# Patient Record
Sex: Female | Born: 2000
Health system: Southern US, Community
[De-identification: ages and names within clinical notes are randomized; demographics above are authoritative.]

## PROBLEM LIST (undated history)

## (undated) DIAGNOSIS — D571 Sickle-cell disease without crisis: Secondary | ICD-10-CM

## (undated) DIAGNOSIS — H539 Unspecified visual disturbance: Secondary | ICD-10-CM

## (undated) HISTORY — PX: NO PAST SURGERIES: SHX2092

---
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
## 2020-01-27 ENCOUNTER — Other Ambulatory Visit: Payer: Self-pay

## 2020-01-27 ENCOUNTER — Inpatient Hospital Stay (HOSPITAL_COMMUNITY)
Admission: AD | Admit: 2020-01-27 | Discharge: 2020-01-28 | Disposition: A | Discharge status: Home / Self Care | Payer: Medicaid Other | Attending: Obstetrics and Gynecology | Admitting: Obstetrics and Gynecology

## 2020-01-27 ENCOUNTER — Inpatient Hospital Stay (HOSPITAL_BASED_OUTPATIENT_CLINIC_OR_DEPARTMENT_OTHER): Payer: Medicaid Other

## 2020-01-27 ENCOUNTER — Encounter (HOSPITAL_COMMUNITY): Payer: Self-pay | Admitting: Obstetrics and Gynecology

## 2020-01-27 DIAGNOSIS — Z3A21 21 weeks gestation of pregnancy: Secondary | ICD-10-CM | POA: Insufficient documentation

## 2020-01-27 DIAGNOSIS — Z3687 Encounter for antenatal screening for uncertain dates: Secondary | ICD-10-CM | POA: Diagnosis not present

## 2020-01-27 DIAGNOSIS — Z3A27 27 weeks gestation of pregnancy: Secondary | ICD-10-CM

## 2020-01-27 DIAGNOSIS — D571 Sickle-cell disease without crisis: Secondary | ICD-10-CM

## 2020-01-27 DIAGNOSIS — O26892 Other specified pregnancy related conditions, second trimester: Secondary | ICD-10-CM

## 2020-01-27 DIAGNOSIS — R519 Headache, unspecified: Secondary | ICD-10-CM | POA: Diagnosis not present

## 2020-01-27 DIAGNOSIS — O0932 Supervision of pregnancy with insufficient antenatal care, second trimester: Secondary | ICD-10-CM | POA: Insufficient documentation

## 2020-01-27 DIAGNOSIS — Z363 Encounter for antenatal screening for malformations: Secondary | ICD-10-CM

## 2020-01-27 DIAGNOSIS — O99012 Anemia complicating pregnancy, second trimester: Secondary | ICD-10-CM

## 2020-01-27 DIAGNOSIS — G4489 Other headache syndrome: Secondary | ICD-10-CM

## 2020-01-27 LAB — RAPID URINE DRUG SCREEN, HOSP PERFORMED
Amphetamines: NOT DETECTED
Barbiturates: NOT DETECTED
Benzodiazepines: NOT DETECTED
Cocaine: NOT DETECTED
Opiates: NOT DETECTED
Tetrahydrocannabinol: NOT DETECTED

## 2020-01-27 LAB — COMPREHENSIVE METABOLIC PANEL
ALT: 14 U/L (ref 0–44)
AST: 19 U/L (ref 15–41)
Albumin: 3 g/dL — ABNORMAL LOW (ref 3.5–5.0)
Alkaline Phosphatase: 73 U/L (ref 38–126)
Anion gap: 9 (ref 5–15)
BUN: <5 mg/dL — ABNORMAL LOW (ref 6–20)
CO2: 22 mmol/L (ref 22–32)
Calcium: 8.6 mg/dL — ABNORMAL LOW (ref 8.9–10.3)
Chloride: 105 mmol/L (ref 98–111)
Creatinine, Ser: 0.48 mg/dL (ref 0.44–1.00)
GFR, Estimated: >60 mL/min (ref >60)
Glucose, Bld: 114 mg/dL — ABNORMAL HIGH (ref 70–99)
Potassium: 3.4 mmol/L — ABNORMAL LOW (ref 3.5–5.1)
Sodium: 136 mmol/L (ref 135–145)
Total Bilirubin: 0.9 mg/dL (ref 0.3–1.2)
Total Protein: 6.4 g/dL — ABNORMAL LOW (ref 6.5–8.1)

## 2020-01-27 LAB — CBC WITH DIFFERENTIAL/PLATELET
Abs Immature Granulocytes: 0.08 10*3/uL — ABNORMAL HIGH (ref 0.00–0.07)
Basophils Absolute: 0.1 10*3/uL (ref 0.0–0.1)
Basophils Relative: 0 %
Eosinophils Absolute: 0.6 10*3/uL — ABNORMAL HIGH (ref 0.0–0.5)
Eosinophils Relative: 5 %
HCT: 27.5 % — ABNORMAL LOW (ref 36.0–46.0)
Hemoglobin: 10.3 g/dL — ABNORMAL LOW (ref 12.0–15.0)
Immature Granulocytes: 1 %
Lymphocytes Relative: 26 %
Lymphs Abs: 3.1 10*3/uL (ref 0.7–4.0)
MCH: 31.7 pg (ref 26.0–34.0)
MCHC: 37.5 g/dL — ABNORMAL HIGH (ref 30.0–36.0)
MCV: 84.6 fL (ref 80.0–100.0)
Monocytes Absolute: 1.3 10*3/uL — ABNORMAL HIGH (ref 0.1–1.0)
Monocytes Relative: 11 %
Neutro Abs: 6.6 10*3/uL (ref 1.7–7.7)
Neutrophils Relative %: 57 %
Platelets: 249 10*3/uL (ref 150–400)
RBC: 3.25 MIL/uL — ABNORMAL LOW (ref 3.87–5.11)
RDW: 14.1 % (ref 11.5–15.5)
WBC: 11.7 10*3/uL — ABNORMAL HIGH (ref 4.0–10.5)
nRBC: 1.4 % — ABNORMAL HIGH (ref 0.0–0.2)

## 2020-01-27 LAB — OB RESULTS CONSOLE GBS: GBS: POSITIVE

## 2020-01-27 LAB — URINALYSIS, ROUTINE W REFLEX MICROSCOPIC
Bilirubin Urine: NEGATIVE
Glucose, UA: NEGATIVE mg/dL
Hgb urine dipstick: NEGATIVE
Ketones, ur: NEGATIVE mg/dL
Nitrite: NEGATIVE
Protein, ur: NEGATIVE mg/dL
Specific Gravity, Urine: 1.01 (ref 1.005–1.030)
WBC, UA: >50 WBC/hpf — ABNORMAL HIGH (ref 0–5)
pH: 6 (ref 5.0–8.0)

## 2020-01-27 LAB — RETICULOCYTES
Immature Retic Fract: 32.6 % — ABNORMAL HIGH (ref 2.3–15.9)
RBC.: 3.25 MIL/uL — ABNORMAL LOW (ref 3.87–5.11)
Retic Count, Absolute: 194.3 10*3/uL — ABNORMAL HIGH (ref 19.0–186.0)
Retic Ct Pct: 6 % — ABNORMAL HIGH (ref 0.4–3.1)

## 2020-01-27 LAB — WET PREP, GENITAL
Clue Cells Wet Prep HPF POC: NONE SEEN
Sperm: NONE SEEN
Trich, Wet Prep: NONE SEEN
Yeast Wet Prep HPF POC: NONE SEEN

## 2020-01-27 MED ORDER — LORATADINE 10 MG PO TABS
10.0000 mg | ORAL_TABLET | Freq: Every day | ORAL | Status: DC
  Administered 2020-01-27: 10 mg via ORAL
  Filled 2020-01-27: qty 1

## 2020-01-27 MED ORDER — PREPLUS 27-1 MG PO TABS: 1.0000 | ORAL_TABLET | Freq: Every day | ORAL | 13 refills | Status: DC

## 2020-01-27 MED ORDER — ACETAMINOPHEN 500 MG PO TABS
1000.0000 mg | ORAL_TABLET | Freq: Once | ORAL | Status: AC
Start: 2020-01-27 — End: 2020-01-27
  Administered 2020-01-27: 1000 mg via ORAL
  Filled 2020-01-27: qty 2

## 2020-01-27 MED ORDER — ACETAMINOPHEN 500 MG PO TABS: 500.0000 mg | ORAL_TABLET | Freq: Four times a day (QID) | ORAL | 2 refills | Status: DC | PRN

## 2020-01-27 NOTE — MAU Provider Note (Addendum)
History     CSN: [id number]  Arrival date and time: 01/27/20 1749   First Provider Initiated Contact with Patient 01/27/20 2053      Chief Complaint  Patient presents with  . Headache   HPI Amanda Burch is a 19 y.o. G1P0 at [redacted]w[redacted]d who presents to MAU with chief complaint of headache and concern for sickle cell crisis. Patient endorses recurrent daily headache. She is managing her pain with intermittent use of Tylenol but is out of pills. On arrival to MAU her pain score is 2/10, anterior in the middle of her forehead. Her pain does not radiate.   Patient states she does not plan to get prenatal care. She was considering an elective termination but realizes she "may be out of time". She has questions for CNM regarding "temporary adoption" so she can attend college.  She denies lower abdominal pain, vaginal bleeding, leaking of fluid, dysuria.   She has not initiated prenatal care  OB History    Gravida  1   Para      Term      Preterm      AB      Living        SAB      TAB      Ectopic      Multiple      Live Births              Past Medical History:  Diagnosis Date  . Sickle cell anemia (HCC)   . Vision abnormalities     Past Surgical History:  Procedure Laterality Date  . NO PAST SURGERIES      Family History  Problem Relation Age of Onset  . Asthma Mother   . Sarcoidosis Mother   . Deep vein thrombosis Mother     Social History   Tobacco Use  . Smoking status: Never Smoker  . Smokeless tobacco: Never Used  Substance Use Topics  . Alcohol use: No  . Drug use: No    Allergies: No Known Allergies  Medications Prior to Admission  Medication Sig Dispense Refill Last Dose  . HYDROcodone-acetaminophen (NORCO/VICODIN) 5-325 MG per tablet Take 1 tablet by mouth every 6 (six) hours as needed for moderate pain. 20 tablet 0   . ibuprofen (ADVIL,MOTRIN) 600 MG tablet Take 1 tablet (600 mg total) by mouth every 6 (six) hours. Scheduled for 2  additional days, then as needed for pain. 30 tablet 0   . polyethylene glycol (MIRALAX / GLYCOLAX) packet Take 136 g by mouth once. 136g (8 packet) in 32oz of fluid. 14 each 0     Review of Systems  Constitutional: Positive for fatigue.  Neurological: Positive for headaches.  All other systems reviewed and are negative.  Physical Exam   Blood pressure (!) 134/55, pulse 92, temperature 98.8 F (37.1 C), temperature source Oral, resp. rate 17, height 5\' 7"  (1.702 m), weight 88.5 kg, last menstrual period 09/02/2019, SpO2 100 %.  Physical Exam Vitals and nursing note reviewed.  Constitutional:      Appearance: She is well-developed. She is not ill-appearing.  Cardiovascular:     Rate and Rhythm: Normal rate.     Heart sounds: Normal heart sounds.  Pulmonary:     Effort: Pulmonary effort is normal.     Breath sounds: Normal breath sounds.  Abdominal:     Palpations: Abdomen is soft.     Comments: Gravid, FH 24cm  Skin:    General: Skin is warm  and dry.     Capillary Refill: Capillary refill takes less than 2 seconds.  Neurological:     Mental Status: She is alert.  Psychiatric:        Mood and Affect: Mood normal.        Speech: Speech normal.        Behavior: Behavior normal.     MAU Course  Procedures  Results for orders placed or performed during the hospital encounter of 01/27/20 (from the past 24 hour(s))  Urinalysis, Routine w reflex microscopic Urine, Clean Catch     Status: Abnormal   Collection Time: 01/27/20  7:48 PM  Result Value Ref Range   Color, Urine YELLOW YELLOW   APPearance HAZY (A) CLEAR   Specific Gravity, Urine 1.010 1.005 - 1.030   pH 6.0 5.0 - 8.0   Glucose, UA NEGATIVE NEGATIVE mg/dL   Hgb urine dipstick NEGATIVE NEGATIVE   Bilirubin Urine NEGATIVE NEGATIVE   Ketones, ur NEGATIVE NEGATIVE mg/dL   Protein, ur NEGATIVE NEGATIVE mg/dL   Nitrite NEGATIVE NEGATIVE   Leukocytes,Ua LARGE (A) NEGATIVE   RBC / HPF 0-5 0 - 5 RBC/hpf   WBC, UA >50 (H)  0 - 5 WBC/hpf   Bacteria, UA MANY (A) NONE SEEN   Squamous Epithelial / LPF 0-5 0 - 5   Mucus PRESENT   Rapid urine drug screen (hospital performed)     Status: None   Collection Time: 01/27/20  7:48 PM  Result Value Ref Range   Opiates NONE DETECTED NONE DETECTED   Cocaine NONE DETECTED NONE DETECTED   Benzodiazepines NONE DETECTED NONE DETECTED   Amphetamines NONE DETECTED NONE DETECTED   Tetrahydrocannabinol NONE DETECTED NONE DETECTED   Barbiturates NONE DETECTED NONE DETECTED  CBC with Differential/Platelet     Status: Abnormal   Collection Time: 01/27/20  8:35 PM  Result Value Ref Range   WBC 11.7 (H) 4.0 - 10.5 K/uL   RBC 3.25 (L) 3.87 - 5.11 MIL/uL   Hemoglobin 10.3 (L) 12.0 - 15.0 g/dL   HCT 16.1 (L) 36 - 46 %   MCV 84.6 80.0 - 100.0 fL   MCH 31.7 26.0 - 34.0 pg   MCHC 37.5 (H) 30.0 - 36.0 g/dL   RDW 09.6 04.5 - 40.9 %   Platelets 249 150 - 400 K/uL   nRBC 1.4 (H) 0.0 - 0.2 %   Neutrophils Relative % 57 %   Neutro Abs 6.6 1.7 - 7.7 K/uL   Lymphocytes Relative 26 %   Lymphs Abs 3.1 0.7 - 4.0 K/uL   Monocytes Relative 11 %   Monocytes Absolute 1.3 (H) 0.1 - 1.0 K/uL   Eosinophils Relative 5 %   Eosinophils Absolute 0.6 (H) 0.0 - 0.5 K/uL   Basophils Relative 0 %   Basophils Absolute 0.1 0.0 - 0.1 K/uL   Immature Granulocytes 1 %   Abs Immature Granulocytes 0.08 (H) 0.00 - 0.07 K/uL  Reticulocytes     Status: Abnormal   Collection Time: 01/27/20  8:35 PM  Result Value Ref Range   Retic Ct Pct 6.0 (H) 0.4 - 3.1 %   RBC. 3.25 (L) 3.87 - 5.11 MIL/uL   Retic Count, Absolute 194.3 (H) 19.0 - 186.0 K/uL   Immature Retic Fract 32.6 (H) 2.3 - 15.9 %  Comprehensive metabolic panel     Status: Abnormal   Collection Time: 01/27/20  8:35 PM  Result Value Ref Range   Sodium 136 135 - 145 mmol/L   Potassium  3.4 (L) 3.5 - 5.1 mmol/L   Chloride 105 98 - 111 mmol/L   CO2 22 22 - 32 mmol/L   Glucose, Bld 114 (H) 70 - 99 mg/dL   BUN <5 (L) 6 - 20 mg/dL   Creatinine, Ser 4.97  0.44 - 1.00 mg/dL   Calcium 8.6 (L) 8.9 - 10.3 mg/dL   Total Protein 6.4 (L) 6.5 - 8.1 g/dL   Albumin 3.0 (L) 3.5 - 5.0 g/dL   AST 19 15 - 41 U/L   ALT 14 0 - 44 U/L   Alkaline Phosphatase 73 38 - 126 U/L   Total Bilirubin 0.9 0.3 - 1.2 mg/dL   GFR, Estimated >02 >63 mL/min   Anion gap 9 5 - 15  Wet prep, genital     Status: Abnormal   Collection Time: 01/27/20  9:45 PM   Specimen: Vaginal  Result Value Ref Range   Yeast Wet Prep HPF POC NONE SEEN NONE SEEN   Trich, Wet Prep NONE SEEN NONE SEEN   Clue Cells Wet Prep HPF POC NONE SEEN NONE SEEN   WBC, Wet Prep HPF POC MANY (A) NONE SEEN   Sperm NONE SEEN      Orders Placed This Encounter  Procedures  . Wet prep, genital  . [place] MFM OB LIMITED  . Urinalysis, Routine w reflex microscopic Urine, Clean Catch  . CBC with Differential/Platelet  . Reticulocytes  . Comprehensive metabolic panel  . Rapid urine drug screen (hospital performed)  . Nursing communication    Patient Vitals for the past 24 hrs:  BP Temp Temp src Pulse Resp SpO2 Height Weight  01/27/20 1900 (!) 134/55 98.8 F (37.1 C) Oral 92 17 100 % 5\' 7"  (1.702 m) 88.5 kg    Report given to , CNM who assumes care of patient at this time  Sabas Sous, MSN, CNM Certified Nurse Midwife, [employer] [facility] [employer], [facility] 01/27/20 10:02 PM   Assessment and Plan      Reassessment (11:50 PM) 14/03/21 results as above. Provider to bedside to discuss. Informed that EDD would be changed to reflect GA of 27.2 weeks. Cautioned that this is based on preliminary report and could be changed after review by MD.  Emphasized need for initiation of prenatal care. Discussed proper nutrition during pregnancy. Informed of bacteria in urine.  Will send UC.  Patient reports some difficulties with transportation and [facility] ~ 10 minutes from her home.  Message sent to staff for scheduling of appt.  Patient instructed to contact  [facility] if she can not get an appt at [facility].  Given expectation for first PN visit to include labs, glucola, and possibly [facility].  Rx for PNV sent to pharmacy on file.  Patient expressed desire for adoption. Reassured that resources/information is available. Encouraged to call or return to MAU if symptoms worsen or with the onset of new symptoms. Discharged to home in stable condition.  [place] MSN, CNM Advanced Practice Provider, [facility]

## 2020-01-27 NOTE — MAU Note (Signed)
Has had headaches, been going on since 2018 or 2018.  Seem to be getting worse. Always on the rt side. Happens every day.  Real "slight" right now.  When at work, she gets dizzy.  Been feeling weak. Has not been seen yet with preg.  Didn't having insurance, now 'has her card'.

## 2020-01-28 NOTE — Discharge Instructions (Signed)
Eating Plan for Pregnant Women While you are pregnant, your body requires additional nutrition to help support your growing baby. You also have a higher need for some vitamins and minerals, such as folic acid, calcium, iron, and vitamin D. Eating a healthy, well-balanced diet is very important for your health and your baby's health. Your need for extra calories varies for the three 55-month segments of your pregnancy (trimesters). For most women, it is recommended to consume:  150 extra calories a day during the first trimester.  300 extra calories a day during the second trimester.  300 extra calories a day during the third trimester. What are tips for following this plan?   Do not try to lose weight or go on a diet during pregnancy.  Limit your overall intake of foods that have "empty calories." These are foods that have little nutritional value, such as sweets, desserts, candies, and sugar-sweetened beverages.  Eat a variety of foods (especially fruits and vegetables) to get a full range of vitamins and minerals.  Take a prenatal vitamin to help meet your additional vitamin and mineral needs during pregnancy, specifically for folic acid, iron, calcium, and vitamin D.  Remember to stay active. Ask your health care provider what types of exercise and activities are safe for you.  Practice good food safety and cleanliness. Wash your hands before you eat and after you prepare raw meat. Wash all fruits and vegetables well before peeling or eating. Taking these actions can help to prevent food-borne illnesses that can be very dangerous to your baby, such as listeriosis. Ask your health care provider for more information about listeriosis. What does 150 extra calories look like? Healthy options that provide 150 extra calories each day could be any of the following:  6-8 oz (170-230 g) of plain low-fat yogurt with  cup of berries.  1 apple with 2 teaspoons (11 g) of peanut butter.  Cut-up  vegetables with  cup (60 g) of hummus.  8 oz (230 mL) or 1 cup of low-fat chocolate milk.  1 stick of string cheese with 1 medium orange.  1 peanut butter and jelly sandwich that is made with one slice of whole-wheat bread and 1 tsp (5 g) of peanut butter. For 300 extra calories, you could eat two of those healthy options each day. What is a healthy amount of weight to gain? The right amount of weight gain for you is based on your BMI before you became pregnant. If your BMI:  Was less than 18 (underweight), you should gain 28-40 lb (13-18 kg).  Was 18-24.9 (normal), you should gain 25-35 lb (11-16 kg).  Was 25-29.9 (overweight), you should gain 15-25 lb (7-11 kg).  Was 30 or greater (obese), you should gain 11-20 lb (5-9 kg). What if I am having twins or multiples? Generally, if you are carrying twins or multiples:  You may need to eat 300-600 extra calories a day.  The recommended range for total weight gain is 25-54 lb (11-25 kg), depending on your BMI before pregnancy.  Talk with your health care provider to find out about nutritional needs, weight gain, and exercise that is right for you. What foods can I eat?  Fruits All fruits. Eat a variety of colors and types of fruit. Remember to wash your fruits well before peeling or eating. Vegetables All vegetables. Eat a variety of colors and types of vegetables. Remember to wash your vegetables well before peeling or eating. Grains All grains. Choose whole grains, such  as whole-wheat bread, oatmeal, or brown rice. Meats and other protein foods Lean meats, including chicken, [place], fish, and lean cuts of beef, veal, or pork. If you eat fish or seafood, choose options that are higher in omega-3 fatty acids and lower in mercury, such as salmon, herring, mussels, trout, sardines, pollock, shrimp, crab, and lobster. Tofu. Tempeh. Beans. Eggs. Peanut butter and other nut butters. Make sure that all meats, poultry, and eggs are cooked to  food-safe temperatures or "well-done." Two or more servings of fish are recommended each week in order to get the most benefits from omega-3 fatty acids that are found in seafood. Choose fish that are lower in mercury. You can find more information online:  GuamGaming.ch Dairy Pasteurized milk and milk alternatives (such as almond milk). Pasteurized yogurt and pasteurized cheese. Cottage cheese. Sour cream. Beverages Water. Juices that contain 100% fruit juice or vegetable juice. Caffeine-free teas and decaffeinated coffee. Drinks that contain caffeine are okay to drink, but it is better to avoid caffeine. Keep your total caffeine intake to less than 200 mg each day (which is 12 oz or 355 mL of coffee, tea, or soda) or the limit as told by your health care provider. Fats and oils Fats and oils are okay to include in moderation. Sweets and desserts Sweets and desserts are okay to include in moderation. Seasoning and other foods All pasteurized condiments. The items listed above may not be a complete list of foods and beverages you can eat. Contact a dietitian for more information. What foods are not recommended? Fruits Unpasteurized fruit juices. Vegetables Raw (unpasteurized) vegetable juices. Meats and other protein foods Lunch meats, bologna, hot dogs, or other deli meats. (If you must eat those meats, reheat them until they are steaming hot.) Refrigerated pat, meat spreads from a meat counter, smoked seafood that is found in the refrigerated section of a store. Raw or undercooked meats, poultry, and eggs. Raw fish, such as sushi or sashimi. Fish that have high mercury content, such as tilefish, shark, swordfish, and king mackerel. To learn more about mercury in fish, talk with your health care provider or look for online resources, such as:  [employer]Gaming.ch Dairy Raw (unpasteurized) milk and any foods that have raw milk in them. Soft cheeses, such as feta, queso blanco, queso fresco, Brie,  Camembert cheeses, blue-veined cheeses, and Panela cheese (unless it is made with pasteurized milk, which must be stated on the label). Beverages Alcohol. Sugar-sweetened beverages, such as sodas, teas, or energy drinks. Seasoning and other foods Homemade fermented foods and drinks, such as pickles, sauerkraut, or kombucha drinks. (Store-bought pasteurized versions of these are okay.) Salads that are made in a store or deli, such as ham salad, chicken salad, egg salad, tuna salad, and seafood salad. The items listed above may not be a complete list of foods and beverages you should avoid. Contact a dietitian for more information. Where to find more information To calculate the number of calories you need based on your height, weight, and activity level, you can use an online calculator such as:  MobileTransition.ch To calculate how much weight you should gain during pregnancy, you can use an online pregnancy weight gain calculator such as:  StreamingFood.com.cy Summary  While you are pregnant, your body requires additional nutrition to help support your growing baby.  Eat a variety of foods, especially fruits and vegetables to get a full range of vitamins and minerals.  Practice good food safety and cleanliness. Wash your hands before you eat  and after you prepare raw meat. Wash all fruits and vegetables well before peeling or eating. Taking these actions can help to prevent food-borne illnesses, such as listeriosis, that can be very dangerous to your baby.  Do not eat raw meat or fish. Do not eat fish that have high mercury content, such as tilefish, shark, swordfish, and king mackerel. Do not eat unpasteurized (raw) dairy.  Take a prenatal vitamin to help meet your additional vitamin and mineral needs during pregnancy, specifically for folic acid, iron, calcium, and vitamin D. This information is not intended to replace advice given to you by  your health care provider. Make sure you discuss any questions you have with your health care provider. Document Revised: 07/01/2018 Document Reviewed: 11/07/2016 Elsevier Patient Education  2020 [employer]. Third Trimester of Pregnancy The third trimester is from week 28 through week 40 (months 7 through 9). The third trimester is a time when the unborn baby (fetus) is growing rapidly. At the end of the ninth month, the fetus is about 20 inches in length and weighs 6-10 pounds. Body changes during your third trimester Your body will continue to go through many changes during pregnancy. The changes vary from woman to woman. During the third trimester:  Your weight will continue to increase. You can expect to gain 25-35 pounds (11-16 kg) by the end of the pregnancy.  You may begin to get stretch marks on your hips, abdomen, and breasts.  You may urinate more often because the fetus is moving lower into your pelvis and pressing on your bladder.  You may develop or continue to have heartburn. This is caused by increased hormones that slow down muscles in the digestive tract.  You may develop or continue to have constipation because increased hormones slow digestion and cause the muscles that push waste through your intestines to relax.  You may develop hemorrhoids. These are swollen veins (varicose veins) in the rectum that can itch or be painful.  You may develop swollen, bulging veins (varicose veins) in your legs.  You may have increased body aches in the pelvis, back, or thighs. This is due to weight gain and increased hormones that are relaxing your joints.  You may have changes in your hair. These can include thickening of your hair, rapid growth, and changes in texture. Some women also have hair loss during or after pregnancy, or hair that feels dry or thin. Your hair will most likely return to normal after your baby is born.  Your breasts will continue to grow and they will continue  to become tender. A yellow fluid (colostrum) may leak from your breasts. This is the first milk you are producing for your baby.  Your belly button may stick out.  You may notice more swelling in your hands, face, or ankles.  You may have increased tingling or numbness in your hands, arms, and legs. The skin on your belly may also feel numb.  You may feel short of breath because of your expanding uterus.  You may have more problems sleeping. This can be caused by the size of your belly, increased need to urinate, and an increase in your body's metabolism.  You may notice the fetus "dropping," or moving lower in your abdomen (lightening).  You may have increased vaginal discharge.  You may notice your joints feel loose and you may have pain around your pelvic bone. What to expect at prenatal visits You will have prenatal exams every 2 weeks until week  36. Then you will have weekly prenatal exams. During a routine prenatal visit:  You will be weighed to make sure you and the baby are growing normally.  Your blood pressure will be taken.  Your abdomen will be measured to track your baby's growth.  The fetal heartbeat will be listened to.  Any test results from the previous visit will be discussed.  You may have a cervical check near your due date to see if your cervix has softened or thinned (effaced).  You will be tested for Group B streptococcus. This happens between 35 and 37 weeks. Your health care provider may ask you:  What your birth plan is.  How you are feeling.  If you are feeling the baby move.  If you have had any abnormal symptoms, such as leaking fluid, bleeding, severe headaches, or abdominal cramping.  If you are using any tobacco products, including cigarettes, chewing tobacco, and electronic cigarettes.  If you have any questions. Other tests or screenings that may be performed during your third trimester include:  Blood tests that check for low iron  levels (anemia).  Fetal testing to check the health, activity level, and growth of the fetus. Testing is done if you have certain medical conditions or if there are problems during the pregnancy.  Nonstress test (NST). This test checks the health of your baby to make sure there are no signs of problems, such as the baby not getting enough oxygen. During this test, a belt is placed around your belly. The baby is made to move, and its heart rate is monitored during movement. What is false labor? False labor is a condition in which you feel small, irregular tightenings of the muscles in the womb (contractions) that usually go away with rest, changing position, or drinking water. These are called Braxton Hicks contractions. Contractions may last for hours, days, or even weeks before true labor sets in. If contractions come at regular intervals, become more frequent, increase in intensity, or become painful, you should see your health care provider. What are the signs of labor?  Abdominal cramps.  Regular contractions that start at 10 minutes apart and become stronger and more frequent with time.  Contractions that start on the top of the uterus and spread down to the lower abdomen and back.  Increased pelvic pressure and dull back pain.  A watery or bloody mucus discharge that comes from the vagina.  Leaking of amniotic fluid. This is also known as your "water breaking." It could be a slow trickle or a gush. Let your health care provider know if it has a color or strange odor. If you have any of these signs, call your health care provider right away, even if it is before your due date. Follow these instructions at home: Medicines  Follow your health care provider's instructions regarding medicine use. Specific medicines may be either safe or unsafe to take during pregnancy.  Take a prenatal vitamin that contains at least 600 micrograms (mcg) of folic acid.  If you develop constipation, try  taking a stool softener if your health care provider approves. Eating and drinking   Eat a balanced diet that includes fresh fruits and vegetables, whole grains, good sources of protein such as meat, eggs, or tofu, and low-fat dairy. Your health care provider will help you determine the amount of weight gain that is right for you.  Avoid raw meat and uncooked cheese. These carry germs that can cause birth defects in the baby.  If you have low calcium intake from food, talk to your health care provider about whether you should take a daily calcium supplement.  Eat four or five small meals rather than three large meals a day.  Limit foods that are high in fat and processed sugars, such as fried and sweet foods.  To prevent constipation: ? Drink enough fluid to keep your urine clear or pale yellow. ? Eat foods that are high in fiber, such as fresh fruits and vegetables, whole grains, and beans. Activity  Exercise only as directed by your health care provider. Most women can continue their usual exercise routine during pregnancy. Try to exercise for 30 minutes at least 5 days a week. Stop exercising if you experience uterine contractions.  Avoid heavy lifting.  Do not exercise in extreme heat or humidity, or at high altitudes.  Wear low-heel, comfortable shoes.  Practice good posture.  You may continue to have sex unless your health care provider tells you otherwise. Relieving pain and discomfort  Take frequent breaks and rest with your legs elevated if you have leg cramps or low back pain.  Take warm sitz baths to soothe any pain or discomfort caused by hemorrhoids. Use hemorrhoid cream if your health care provider approves.  Wear a good support bra to prevent discomfort from breast tenderness.  If you develop varicose veins: ? Wear support pantyhose or compression stockings as told by your healthcare provider. ? Elevate your feet for 15 minutes, 3-4 times a day. Prenatal  care  Write down your questions. Take them to your prenatal visits.  Keep all your prenatal visits as told by your health care provider. This is important. Safety  Wear your seat belt at all times when driving.  Make a list of emergency phone numbers, including numbers for family, friends, the hospital, and police and fire departments. General instructions  Avoid cat litter boxes and soil used by cats. These carry germs that can cause birth defects in the baby. If you have a cat, ask someone to clean the litter box for you.  Do not travel far distances unless it is absolutely necessary and only with the approval of your health care provider.  Do not use hot tubs, steam rooms, or saunas.  Do not drink alcohol.  Do not use any products that contain nicotine or tobacco, such as cigarettes and e-cigarettes. If you need help quitting, ask your health care provider.  Do not use any medicinal herbs or unprescribed drugs. These chemicals affect the formation and growth of the baby.  Do not douche or use tampons or scented sanitary pads.  Do not cross your legs for long periods of time.  To prepare for the arrival of your baby: ? Take prenatal classes to understand, practice, and ask questions about labor and delivery. ? Make a trial run to the hospital. ? Visit the hospital and tour the maternity area. ? Arrange for maternity or paternity leave through employers. ? Arrange for family and friends to take care of pets while you are in the hospital. ? Purchase a rear-facing car seat and make sure you know how to install it in your car. ? Pack your hospital bag. ? Prepare the babys nursery. Make sure to remove all pillows and stuffed animals from the baby's crib to prevent suffocation.  Visit your dentist if you have not gone during your pregnancy. Use a soft toothbrush to brush your teeth and be gentle when you floss. Contact a health care provider  if:  You are unsure if you are in  labor or if your water has broken.  You become dizzy.  You have mild pelvic cramps, pelvic pressure, or nagging pain in your abdominal area.  You have lower back pain.  You have persistent nausea, vomiting, or diarrhea.  You have an unusual or bad smelling vaginal discharge.  You have pain when you urinate. Get help right away if:  Your water breaks before 37 weeks.  You have regular contractions less than 5 minutes apart before 37 weeks.  You have a fever.  You are leaking fluid from your vagina.  You have spotting or bleeding from your vagina.  You have severe abdominal pain or cramping.  You have rapid weight loss or weight gain.  You have shortness of breath with chest pain.  You notice sudden or extreme swelling of your face, hands, ankles, feet, or legs.  Your baby makes fewer than 10 movements in 2 hours.  You have severe headaches that do not go away when you take medicine.  You have vision changes. Summary  The third trimester is from week 28 through week 40, months 7 through 9. The third trimester is a time when the unborn baby (fetus) is growing rapidly.  During the third trimester, your discomfort may increase as you and your baby continue to gain weight. You may have abdominal, leg, and back pain, sleeping problems, and an increased need to urinate.  During the third trimester your breasts will keep growing and they will continue to become tender. A yellow fluid (colostrum) may leak from your breasts. This is the first milk you are producing for your baby.  False labor is a condition in which you feel small, irregular tightenings of the muscles in the womb (contractions) that eventually go away. These are called Braxton Hicks contractions. Contractions may last for hours, days, or even weeks before true labor sets in.  Signs of labor can include: abdominal cramps; regular contractions that start at 10 minutes apart and become stronger and more frequent  with time; watery or bloody mucus discharge that comes from the vagina; increased pelvic pressure and dull back pain; and leaking of amniotic fluid. This information is not intended to replace advice given to you by your health care provider. Make sure you discuss any questions you have with your health care provider. Document Revised: 06/03/2018 Document Reviewed: 03/18/2016 Elsevier Patient Education  2020 [employer]

## 2020-01-29 LAB — URINE CULTURE: Culture: >=100000 — AB

## 2020-01-30 ENCOUNTER — Encounter: Payer: Self-pay | Admitting: Family Medicine

## 2020-01-30 ENCOUNTER — Other Ambulatory Visit: Payer: Self-pay | Admitting: Family Medicine

## 2020-01-30 DIAGNOSIS — R8271 Bacteriuria: Secondary | ICD-10-CM | POA: Insufficient documentation

## 2020-01-30 LAB — GC/CHLAMYDIA PROBE AMP (~~LOC~~) NOT AT ARMC
Chlamydia: POSITIVE — AB
Comment: NEGATIVE
Comment: NORMAL
Neisseria Gonorrhea: NEGATIVE

## 2020-01-30 MED ORDER — AMOXICILLIN 875 MG PO TABS: 875.0000 mg | ORAL_TABLET | Freq: Two times a day (BID) | ORAL | 0 refills | Status: AC

## 2020-01-30 NOTE — Progress Notes (Signed)
Has GBS UTI (100k colonies). Unable to reach patient by phone - sent mychart message with request to respond so we know she got it.   Treat with Amoxicillin 875mg  BID.

## 2020-02-01 ENCOUNTER — Other Ambulatory Visit: Payer: Self-pay | Admitting: *Deleted

## 2020-02-01 DIAGNOSIS — O0932 Supervision of pregnancy with insufficient antenatal care, second trimester: Secondary | ICD-10-CM

## 2020-02-02 ENCOUNTER — Other Ambulatory Visit: Payer: Self-pay

## 2020-02-02 ENCOUNTER — Other Ambulatory Visit: Payer: Medicaid Other

## 2020-02-02 DIAGNOSIS — A749 Chlamydial infection, unspecified: Secondary | ICD-10-CM | POA: Insufficient documentation

## 2020-02-02 DIAGNOSIS — O0932 Supervision of pregnancy with insufficient antenatal care, second trimester: Secondary | ICD-10-CM

## 2020-02-02 HISTORY — DX: Chlamydial infection, unspecified: A74.9

## 2020-02-02 MED ORDER — AZITHROMYCIN 500 MG PO TABS: 1000.0000 mg | ORAL_TABLET | Freq: Once | ORAL | 0 refills | Status: AC

## 2020-02-02 MED ORDER — AZITHROMYCIN 500 MG PO TABS: 1000.0000 mg | ORAL_TABLET | Freq: Once | ORAL | 0 refills | Status: DC

## 2020-02-03 LAB — GLUCOSE TOLERANCE, 2 HOURS W/ 1HR
Glucose, 1 hour: 109 mg/dL (ref 65–179)
Glucose, 2 hour: 78 mg/dL (ref 65–152)
Glucose, Fasting: 81 mg/dL (ref 65–91)

## 2020-02-03 LAB — OBSTETRIC PANEL, INCLUDING HIV
Antibody Screen: NEGATIVE
Basophils Absolute: 0.1 10*3/uL (ref 0.0–0.2)
Basos: 1 %
EOS (ABSOLUTE): 0.4 10*3/uL (ref 0.0–0.4)
Eos: 5 %
HIV Screen 4th Generation wRfx: NONREACTIVE
Hematocrit: 29.7 % — ABNORMAL LOW (ref 34.0–46.6)
Hemoglobin: 10.3 g/dL — ABNORMAL LOW (ref 11.1–15.9)
Hepatitis B Surface Ag: NEGATIVE
Immature Grans (Abs): 0.1 10*3/uL (ref 0.0–0.1)
Immature Granulocytes: 1 %
Lymphocytes Absolute: 2.8 10*3/uL (ref 0.7–3.1)
Lymphs: 31 %
MCH: 31.3 pg (ref 26.6–33.0)
MCHC: 34.7 g/dL (ref 31.5–35.7)
MCV: 90 fL (ref 79–97)
Monocytes Absolute: 1 10*3/uL — ABNORMAL HIGH (ref 0.1–0.9)
Monocytes: 11 %
NRBC: 2 % — ABNORMAL HIGH (ref 0–0)
Neutrophils Absolute: 4.6 10*3/uL (ref 1.4–7.0)
Neutrophils: 51 %
Platelets: 271 10*3/uL (ref 150–450)
RBC: 3.29 x10E6/uL — ABNORMAL LOW (ref 3.77–5.28)
RDW: 14.3 % (ref 11.7–15.4)
RPR Ser Ql: NONREACTIVE
Rh Factor: POSITIVE
Rubella Antibodies, IGG: 3.38 index (ref >0.99)
WBC: 9 10*3/uL (ref 3.4–10.8)

## 2020-02-08 ENCOUNTER — Telehealth: Payer: Self-pay | Admitting: General Practice

## 2020-02-08 ENCOUNTER — Other Ambulatory Visit: Payer: Self-pay

## 2020-02-08 ENCOUNTER — Telehealth (INDEPENDENT_AMBULATORY_CARE_PROVIDER_SITE_OTHER): Payer: Medicaid Other | Admitting: *Deleted

## 2020-02-08 DIAGNOSIS — O0930 Supervision of pregnancy with insufficient antenatal care, unspecified trimester: Secondary | ICD-10-CM | POA: Insufficient documentation

## 2020-02-08 DIAGNOSIS — Z3A00 Weeks of gestation of pregnancy not specified: Secondary | ICD-10-CM

## 2020-02-08 DIAGNOSIS — D571 Sickle-cell disease without crisis: Secondary | ICD-10-CM

## 2020-02-08 DIAGNOSIS — O0990 Supervision of high risk pregnancy, unspecified, unspecified trimester: Secondary | ICD-10-CM | POA: Insufficient documentation

## 2020-02-08 DIAGNOSIS — O99019 Anemia complicating pregnancy, unspecified trimester: Secondary | ICD-10-CM

## 2020-02-08 NOTE — Telephone Encounter (Signed)
   Josetta Wigal DOB: 2000/09/03 MRN: [id number]   RIDER WAIVER AND RELEASE OF LIABILITY  For purposes of improving physical access to our facilities, [facility] is pleased to partner with third parties to provide [facility] patients or other authorized individuals the option of convenient, on-demand ground transportation services (the Chiropractor") through use of the technology service that enables users to request on-demand ground transportation from independent third-party providers.  By opting to use and accept these [employer], I, the undersigned, hereby agree on behalf of myself, and on behalf of any minor child using the [employer] for whom I am the parent or legal guardian, as follows:  1. Science writer provided to me are provided by independent third-party transportation providers who are not [employer] or employees and who are unaffiliated with [employer]. 2. [facility] is neither a transportation carrier nor a common or public carrier. 3. [facility] has no control over the quality or safety of the transportation that occurs as a result of the [employer]. 4. [facility] cannot guarantee that any third-party transportation provider will complete any arranged transportation service. 5. [facility] makes no representation, warranty, or guarantee regarding the reliability, timeliness, quality, safety, suitability, or availability of any of the Transport Services or that they will be error free. 6. I fully understand that traveling by vehicle involves risks and dangers of serious bodily injury, including permanent disability, paralysis, and death. I agree, on behalf of myself and on behalf of any minor child using the Transport Services for whom I am the parent or legal guardian, that the entire risk arising out of my use of the [employer] remains solely with me, to the maximum extent permitted under applicable law. 7. The [employer] are provided "as is" and "as available." [facility] disclaims all representations and warranties, express, implied or statutory, not expressly set out in these terms, including the implied warranties of merchantability and fitness for a particular purpose. 8. I hereby waive and release [facility], its agents, employees, officers, directors, representatives, insurers, attorneys, assigns, successors, subsidiaries, and affiliates from any and all past, present, or future claims, demands, liabilities, actions, causes of action, or suits of any kind directly or indirectly arising from acceptance and use of the [employer]. 9. I further waive and release  and its affiliates from all present and future liability and responsibility for any injury or death to persons or damages to property caused by or related to the use of the [employer]. 10. I have read this Waiver and Release of Liability, and I understand the terms used in it and their legal significance. This Waiver is freely and voluntarily given with the understanding that my right (as well as the right of any minor child for whom I am the parent or legal guardian using the [employer]) to legal recourse against  in connection with the [employer] is knowingly surrendered in return for use of these services.   I attest that I read the consent document to Crissie Reese, gave Ms. Cost the opportunity to ask questions and answered the questions asked (if any). I affirm that Marit Goodwill then provided consent for she's participation in this program.     Evette Doffing

## 2020-02-08 NOTE — Patient Instructions (Signed)
- At our [facility], we work as an integrated team, providing care to address both physical and emotional health. Your medical provider may refer you to see our Behavioral Health Clinician (BHC) on the same day you see your medical provider, as availability permits; often scheduled virtually at your convenience.  Our BHC is available to all patients, visits generally last between 20-30 minutes, but can be longer or shorter, depending on patient need. The BHC offers help with stress management, coping with symptoms of depression and anxiety, major life changes , sleep issues, changing risky behavior, grief and loss, life stress, working on personal life goals, and  behavioral health issues, as these all affect your overall health and wellness.  The BHC is NOT available for the following: FMLA paperwork, court-ordered evaluations, specialty assessments (custody or disability), letters to employers, or obtaining certification for an emotional support animal. The BHC does not provide long-term therapy. You have the right to refuse integrated behavioral health services, or to reschedule to see the BHC at a later date.  Confidentiality exception: If it is suspected that a child or disabled adult is being abused or neglected, we are required by law to report that to either Child Protective Services or Adult Protective Services.  If you have a diagnosis of Bipolar affective disorder, Schizophrenia, or recurrent Major depressive disorder, we will recommend that you establish care with a psychiatrist, as these are lifelong, chronic conditions, and we want your overall emotional health and medications to be more closely monitored. If you anticipate needing extended maternity leave due to mental health issues postpartum, it it recommended you inform your medical provider, so we can put in a referral to a  psychiatrist as soon as possible. The BHC is unable to recommend an extended maternity leave for mental  health issues. Your medical provider or BHC may refer you to a therapist for ongoing, traditional therapy, or to a psychiatrist, for medication management, if it would benefit your overall health. Depending on your insurance, you may have a copay to see the BHC. If you are uninsured, it is recommended that you apply for financial assistance. (Forms may be requested at the front desk for in-person visits, via MyChart, or request a form during a virtual visit).  If you see the BHC more than 6 times, you will have to complete a comprehensive clinical assessment interview with the BHC to resume integrated services.  For virtual visits with the BHC, you must be physically in the state of [place] at the time of the visit. For example, if you live in [place], you will have to do an in-person visit with the BHC, and your out-of-state insurance may not cover behavioral health services in [place]. f you are going out of the state or country for any reason, the BHC may see you virtually when you return to [place], but not while you are physically outside of .     Meet the Provider Zoom Sessions      [facility] is now offering FREE monthly 1-hour virtual Zoom sessions for new, current, and prospective patients.        During these sessions, you can:   Learn about our practice, model of care, services   Get answers to questions about pregnancy and birth during COVID   Pick your provider's brain about anything else!    Sessions will be hosted by [facility] Nurse Practitioners, Physician Assistants, Physicians and Midwives            No registration required      2021 Dates:      All at 6pm     October 21st     November 18th   December 16th     January 20th  February 17th    To join one of these meetings, a few minutes before it is set to start:     Copy/paste the link into your web  browser:  [email or web address]    OR  Scan the QR code below (open up your camera and point towards QR code; click on tab that pops up on your phone ("zoom")    

## 2020-02-08 NOTE — Progress Notes (Signed)
1024 Amanda Burch not connected virtually. I called her and verified her identity with 2 identifiers. I asked if she was trying to connect virtually as I could not see her but it appears she had tried. Call disconnected and I called her back and left a message I am trying to connect with you by phone or virtually for your visit and will try to connect virtually or by  Phone again in a moment- please be available. I called her a second time and she confirmed her identity and said she cannot connect virtually- she receives error message. I confirmed we will complete visit by phone.  Amanda Diekman,RN  New OB Intake  I connected with  Amanda Burch on 02/08/20 at 10:30 by telephone and verified that I am speaking with the correct person using two identifiers. Nurse is located at [facility] and pt is located at home.  I discussed the limitations, risks, security and privacy concerns of performing an evaluation and management service by telephone and the availability of in person appointments. I also discussed with the patient that there may be a patient responsible charge related to this service. The patient expressed understanding and agreed to proceed.  I explained I am completing New OB Intake today. We discussed her EDD of 04/25/2020 that is based on [place] per provider per chart. States had 2 periods in June  Or July . Pt is G1/P0. I reviewed her allergies, medications, Medical/Surgical/OB history. I informed her of [facility] services. Based on history, this is a/an complicated by sickle cell anemia, late prenatal care pregnancy.  Concerns addressed today  Plans for Delivery: I asked where she planned to deliver.She confirms she had thought about having a home delivery. I informed her we do not offer that service and if that is something she wants she will need to find a provider that can provider that. She confirmed she would prefer to continue with [place].   MyChart/Babyscripts MyChart access verified. I explained pt will  have some visits in office and some virtually    Call disconnected before visit completed. I called her back twice and was unable to connect with her; will need to complete at her in office prenatal visit due to advanced gestational age. Needs to have PHQ9/GAD7/Prenatal weight/ Blood pressure cuff ordered if she does not have one/Babyscripts app/ OB Box.  Amanda Sullenberger,RN 02/08/2020  10:31 AM

## 2020-02-09 ENCOUNTER — Ambulatory Visit (INDEPENDENT_AMBULATORY_CARE_PROVIDER_SITE_OTHER): Payer: Medicaid Other | Admitting: Family Medicine

## 2020-02-09 ENCOUNTER — Telehealth: Payer: Self-pay | Admitting: Lactation Services

## 2020-02-09 ENCOUNTER — Encounter: Payer: Self-pay | Admitting: *Deleted

## 2020-02-09 ENCOUNTER — Encounter: Payer: Self-pay | Admitting: Family Medicine

## 2020-02-09 VITALS — BP 114/60 | HR 78 | Wt 194.7 lb

## 2020-02-09 DIAGNOSIS — D571 Sickle-cell disease without crisis: Secondary | ICD-10-CM

## 2020-02-09 DIAGNOSIS — O0930 Supervision of pregnancy with insufficient antenatal care, unspecified trimester: Secondary | ICD-10-CM

## 2020-02-09 DIAGNOSIS — Z1331 Encounter for screening for depression: Secondary | ICD-10-CM

## 2020-02-09 DIAGNOSIS — O98812 Other maternal infectious and parasitic diseases complicating pregnancy, second trimester: Secondary | ICD-10-CM

## 2020-02-09 DIAGNOSIS — Q8901 Asplenia (congenital): Secondary | ICD-10-CM

## 2020-02-09 DIAGNOSIS — O0990 Supervision of high risk pregnancy, unspecified, unspecified trimester: Secondary | ICD-10-CM

## 2020-02-09 DIAGNOSIS — R8271 Bacteriuria: Secondary | ICD-10-CM

## 2020-02-09 DIAGNOSIS — A749 Chlamydial infection, unspecified: Secondary | ICD-10-CM

## 2020-02-09 DIAGNOSIS — Z658 Other specified problems related to psychosocial circumstances: Secondary | ICD-10-CM

## 2020-02-09 MED ORDER — BLOOD PRESSURE KIT DEVI: 1.0000 | Freq: Once | 0 refills | Status: AC

## 2020-02-09 NOTE — Progress Notes (Deleted)
***  vaccines for sickle ***took chlamydia and GBS treatment

## 2020-02-09 NOTE — Patient Instructions (Signed)
 Third Trimester of Pregnancy The third trimester is from week 28 through week 40 (months 7 through 9). The third trimester is a time when the unborn baby (fetus) is growing rapidly. At the end of the ninth month, the fetus is about 20 inches in length and weighs 6-10 pounds. Body changes during your third trimester Your body will continue to go through many changes during pregnancy. The changes vary from woman to woman. During the third trimester:  Your weight will continue to increase. You can expect to gain 25-35 pounds (11-16 kg) by the end of the pregnancy.  You may begin to get stretch marks on your hips, abdomen, and breasts.  You may urinate more often because the fetus is moving lower into your pelvis and pressing on your bladder.  You may develop or continue to have heartburn. This is caused by increased hormones that slow down muscles in the digestive tract.  You may develop or continue to have constipation because increased hormones slow digestion and cause the muscles that push waste through your intestines to relax.  You may develop hemorrhoids. These are swollen veins (varicose veins) in the rectum that can itch or be painful.  You may develop swollen, bulging veins (varicose veins) in your legs.  You may have increased body aches in the pelvis, back, or thighs. This is due to weight gain and increased hormones that are relaxing your joints.  You may have changes in your hair. These can include thickening of your hair, rapid growth, and changes in texture. Some women also have hair loss during or after pregnancy, or hair that feels dry or thin. Your hair will most likely return to normal after your baby is born.  Your breasts will continue to grow and they will continue to become tender. A yellow fluid (colostrum) may leak from your breasts. This is the first milk you are producing for your baby.  Your belly button may stick out.  You may notice more swelling in your  hands, face, or ankles.  You may have increased tingling or numbness in your hands, arms, and legs. The skin on your belly may also feel numb.  You may feel short of breath because of your expanding uterus.  You may have more problems sleeping. This can be caused by the size of your belly, increased need to urinate, and an increase in your body's metabolism.  You may notice the fetus "dropping," or moving lower in your abdomen (lightening).  You may have increased vaginal discharge.  You may notice your joints feel loose and you may have pain around your pelvic bone. What to expect at prenatal visits You will have prenatal exams every 2 weeks until week 36. Then you will have weekly prenatal exams. During a routine prenatal visit:  You will be weighed to make sure you and the baby are growing normally.  Your blood pressure will be taken.  Your abdomen will be measured to track your baby's growth.  The fetal heartbeat will be listened to.  Any test results from the previous visit will be discussed.  You may have a cervical check near your due date to see if your cervix has softened or thinned (effaced).  You will be tested for Group B streptococcus. This happens between 35 and 37 weeks. Your health care provider may ask you:  What your birth plan is.  How you are feeling.  If you are feeling the baby move.  If you have had any   abnormal symptoms, such as leaking fluid, bleeding, severe headaches, or abdominal cramping.  If you are using any tobacco products, including cigarettes, chewing tobacco, and electronic cigarettes.  If you have any questions. Other tests or screenings that may be performed during your third trimester include:  Blood tests that check for low iron levels (anemia).  Fetal testing to check the health, activity level, and growth of the fetus. Testing is done if you have certain medical conditions or if there are problems during the  pregnancy.  Nonstress test (NST). This test checks the health of your baby to make sure there are no signs of problems, such as the baby not getting enough oxygen. During this test, a belt is placed around your belly. The baby is made to move, and its heart rate is monitored during movement. What is false labor? False labor is a condition in which you feel small, irregular tightenings of the muscles in the womb (contractions) that usually go away with rest, changing position, or drinking water. These are called Braxton Hicks contractions. Contractions may last for hours, days, or even weeks before true labor sets in. If contractions come at regular intervals, become more frequent, increase in intensity, or become painful, you should see your health care provider. What are the signs of labor?  Abdominal cramps.  Regular contractions that start at 10 minutes apart and become stronger and more frequent with time.  Contractions that start on the top of the uterus and spread down to the lower abdomen and back.  Increased pelvic pressure and dull back pain.  A watery or bloody mucus discharge that comes from the vagina.  Leaking of amniotic fluid. This is also known as your "water breaking." It could be a slow trickle or a gush. Let your health care provider know if it has a color or strange odor. If you have any of these signs, call your health care provider right away, even if it is before your due date. Follow these instructions at home: Medicines  Follow your health care provider's instructions regarding medicine use. Specific medicines may be either safe or unsafe to take during pregnancy.  Take a prenatal vitamin that contains at least 600 micrograms (mcg) of folic acid.  If you develop constipation, try taking a stool softener if your health care provider approves. Eating and drinking   Eat a balanced diet that includes fresh fruits and vegetables, whole grains, good sources of protein  such as meat, eggs, or tofu, and low-fat dairy. Your health care provider will help you determine the amount of weight gain that is right for you.  Avoid raw meat and uncooked cheese. These carry germs that can cause birth defects in the baby.  If you have low calcium intake from food, talk to your health care provider about whether you should take a daily calcium supplement.  Eat four or five small meals rather than three large meals a day.  Limit foods that are high in fat and processed sugars, such as fried and sweet foods.  To prevent constipation: ? Drink enough fluid to keep your urine clear or pale yellow. ? Eat foods that are high in fiber, such as fresh fruits and vegetables, whole grains, and beans. Activity  Exercise only as directed by your health care provider. Most women can continue their usual exercise routine during pregnancy. Try to exercise for 30 minutes at least 5 days a week. Stop exercising if you experience uterine contractions.  Avoid heavy lifting.    Do not exercise in extreme heat or humidity, or at high altitudes.  Wear low-heel, comfortable shoes.  Practice good posture.  You may continue to have sex unless your health care provider tells you otherwise. Relieving pain and discomfort  Take frequent breaks and rest with your legs elevated if you have leg cramps or low back pain.  Take warm sitz baths to soothe any pain or discomfort caused by hemorrhoids. Use hemorrhoid cream if your health care provider approves.  Wear a good support bra to prevent discomfort from breast tenderness.  If you develop varicose veins: ? Wear support pantyhose or compression stockings as told by your healthcare provider. ? Elevate your feet for 15 minutes, 3-4 times a day. Prenatal care  Write down your questions. Take them to your prenatal visits.  Keep all your prenatal visits as told by your health care provider. This is important. Safety  Wear your seat belt at  all times when driving.  Make a list of emergency phone numbers, including numbers for family, friends, the hospital, and police and fire departments. General instructions  Avoid cat litter boxes and soil used by cats. These carry germs that can cause birth defects in the baby. If you have a cat, ask someone to clean the litter box for you.  Do not travel far distances unless it is absolutely necessary and only with the approval of your health care provider.  Do not use hot tubs, steam rooms, or saunas.  Do not drink alcohol.  Do not use any products that contain nicotine or tobacco, such as cigarettes and e-cigarettes. If you need help quitting, ask your health care provider.  Do not use any medicinal herbs or unprescribed drugs. These chemicals affect the formation and growth of the baby.  Do not douche or use tampons or scented sanitary pads.  Do not cross your legs for long periods of time.  To prepare for the arrival of your baby: ? Take prenatal classes to understand, practice, and ask questions about labor and delivery. ? Make a trial run to the hospital. ? Visit the hospital and tour the maternity area. ? Arrange for maternity or paternity leave through employers. ? Arrange for family and friends to take care of pets while you are in the hospital. ? Purchase a rear-facing car seat and make sure you know how to install it in your car. ? Pack your hospital bag. ? Prepare the baby's nursery. Make sure to remove all pillows and stuffed animals from the baby's crib to prevent suffocation.  Visit your dentist if you have not gone during your pregnancy. Use a soft toothbrush to brush your teeth and be gentle when you floss. Contact a health care provider if:  You are unsure if you are in labor or if your water has broken.  You become dizzy.  You have mild pelvic cramps, pelvic pressure, or nagging pain in your abdominal area.  You have lower back pain.  You have persistent  nausea, vomiting, or diarrhea.  You have an unusual or bad smelling vaginal discharge.  You have pain when you urinate. Get help right away if:  Your water breaks before 37 weeks.  You have regular contractions less than 5 minutes apart before 37 weeks.  You have a fever.  You are leaking fluid from your vagina.  You have spotting or bleeding from your vagina.  You have severe abdominal pain or cramping.  You have rapid weight loss or weight gain.  You   have shortness of breath with chest pain.  You notice sudden or extreme swelling of your face, hands, ankles, feet, or legs.  Your baby makes fewer than 10 movements in 2 hours.  You have severe headaches that do not go away when you take medicine.  You have vision changes. Summary  The third trimester is from week 28 through week 40, months 7 through 9. The third trimester is a time when the unborn baby (fetus) is growing rapidly.  During the third trimester, your discomfort may increase as you and your baby continue to gain weight. You may have abdominal, leg, and back pain, sleeping problems, and an increased need to urinate.  During the third trimester your breasts will keep growing and they will continue to become tender. A yellow fluid (colostrum) may leak from your breasts. This is the first milk you are producing for your baby.  False labor is a condition in which you feel small, irregular tightenings of the muscles in the womb (contractions) that eventually go away. These are called Braxton Hicks contractions. Contractions may last for hours, days, or even weeks before true labor sets in.  Signs of labor can include: abdominal cramps; regular contractions that start at 10 minutes apart and become stronger and more frequent with time; watery or bloody mucus discharge that comes from the vagina; increased pelvic pressure and dull back pain; and leaking of amniotic fluid. This information is not intended to replace advice  given to you by your health care provider. Make sure you discuss any questions you have with your health care provider. Document Revised: 06/03/2018 Document Reviewed: 03/18/2016 Else[employer] Patient Education  2020 [employer]   Contraception Choices Contraception, also called birth control, refers to methods or devices that prevent pregnancy. Hormonal methods Contraceptive implant  A contraceptive implant is a thin, plastic tube that contains a hormone. It is inserted into the upper part of the arm. It can remain in place for up to 3 years. Progestin-only injections Progestin-only injections are injections of progestin, a synthetic form of the hormone progesterone. They are given every 3 months by a health care provider. Birth control pills  Birth control pills are pills that contain hormones that prevent pregnancy. They must be taken once a day, preferably at the same time each day. Birth control patch  The birth control patch contains hormones that prevent pregnancy. It is placed on the skin and must be changed once a week for three weeks and removed on the fourth week. A prescription is needed to use this method of contraception. Vaginal ring  A vaginal ring contains hormones that prevent pregnancy. It is placed in the vagina for three weeks and removed on the fourth week. After that, the process is repeated with a new ring. A prescription is needed to use this method of contraception. Emergency contraceptive Emergency contraceptives prevent pregnancy after unprotected sex. They come in pill form and can be taken up to 5 days after sex. They work best the sooner they are taken after having sex. Most emergency contraceptives are available without a prescription. This method should not be used as your only form of birth control. Barrier methods Female condom  A female condom is a thin sheath that is worn over the penis during sex. Condoms keep sperm from going inside a woman's body. They can  be used with a spermicide to increase their effectiveness. They should be disposed after a single use. Female condom  A female condom is a soft,   loose-fitting sheath that is put into the vagina before sex. The condom keeps sperm from going inside a woman's body. They should be disposed after a single use. Diaphragm  A diaphragm is a soft, dome-shaped barrier. It is inserted into the vagina before sex, along with a spermicide. The diaphragm blocks sperm from entering the uterus, and the spermicide kills sperm. A diaphragm should be left in the vagina for 6-8 hours after sex and removed within 24 hours. A diaphragm is prescribed and fitted by a health care provider. A diaphragm should be replaced every 1-2 years, after giving birth, after gaining more than 15 lb (6.8 kg), and after pelvic surgery. Cervical cap  A cervical cap is a round, soft latex or plastic cup that fits over the cervix. It is inserted into the vagina before sex, along with spermicide. It blocks sperm from entering the uterus. The cap should be left in place for 6-8 hours after sex and removed within 48 hours. A cervical cap must be prescribed and fitted by a health care provider. It should be replaced every 2 years. Sponge  A sponge is a soft, circular piece of polyurethane foam with spermicide on it. The sponge helps block sperm from entering the uterus, and the spermicide kills sperm. To use it, you make it wet and then insert it into the vagina. It should be inserted before sex, left in for at least 6 hours after sex, and removed and thrown away within 30 hours. Spermicides Spermicides are chemicals that kill or block sperm from entering the cervix and uterus. They can come as a cream, jelly, suppository, foam, or tablet. A spermicide should be inserted into the vagina with an applicator at least 10-15 minutes before sex to allow time for it to work. The process must be repeated every time you have sex. Spermicides do not require  a prescription. Intrauterine contraception Intrauterine device (IUD) An IUD is a T-shaped device that is put in a woman's uterus. There are two types:  Hormone IUD.This type contains progestin, a synthetic form of the hormone progesterone. This type can stay in place for 3-5 years.  Copper IUD.This type is wrapped in copper wire. It can stay in place for 10 years.  Permanent methods of contraception Female tubal ligation In this method, a woman's fallopian tubes are sealed, tied, or blocked during surgery to prevent eggs from traveling to the uterus. Hysteroscopic sterilization In this method, a small, flexible insert is placed into each fallopian tube. The inserts cause scar tissue to form in the fallopian tubes and block them, so sperm cannot reach an egg. The procedure takes about 3 months to be effective. Another form of birth control must be used during those 3 months. Female sterilization This is a procedure to tie off the tubes that carry sperm (vasectomy). After the procedure, the man can still ejaculate fluid (semen). Natural planning methods Natural family planning In this method, a couple does not have sex on days when the woman could become pregnant. Calendar method This means keeping track of the length of each menstrual cycle, identifying the days when pregnancy can happen, and not having sex on those days. Ovulation method In this method, a couple avoids sex during ovulation. Symptothermal method This method involves not having sex during ovulation. The woman typically checks for ovulation by watching changes in her temperature and in the consistency of cervical mucus. Post-ovulation method In this method, a couple waits to have sex until after ovulation. Summary    Contraception, also called birth control, means methods or devices that prevent pregnancy.  Hormonal methods of contraception include implants, injections, pills, patches, vaginal rings, and emergency  contraceptives.  Barrier methods of contraception can include female condoms, female condoms, diaphragms, cervical caps, sponges, and spermicides.  There are two types of IUDs (intrauterine devices). An IUD can be put in a woman's uterus to prevent pregnancy for 3-5 years.  Permanent sterilization can be done through a procedure for males, females, or both.  Natural family planning methods involve not having sex on days when the woman could become pregnant. This information is not intended to replace advice given to you by your health care provider. Make sure you discuss any questions you have with your health care provider. Document Revised: 02/12/2017 Document Reviewed: 03/15/2016 [employer] Patient Education  2020 [employer]   Breastfeeding  Choosing to breastfeed is one of the best decisions you can make for yourself and your baby. A change in hormones during pregnancy causes your breasts to make breast milk in your milk-producing glands. Hormones prevent breast milk from being released before your baby is born. They also prompt milk flow after birth. Once breastfeeding has begun, thoughts of your baby, as well as his or her sucking or crying, can stimulate the release of milk from your milk-producing glands. Benefits of breastfeeding Research shows that breastfeeding offers many health benefits for infants and mothers. It also offers a cost-free and convenient way to feed your baby. For your baby  Your first milk (colostrum) helps your baby's digestive system to function better.  Special cells in your milk (antibodies) help your baby to fight off infections.  Breastfed babies are less likely to develop asthma, allergies, obesity, or type 2 diabetes. They are also at lower risk for sudden infant death syndrome (SIDS).  Nutrients in breast milk are better able to meet your baby's needs compared to infant formula.  Breast milk improves your baby's brain development. For  you  Breastfeeding helps to create a very special bond between you and your baby.  Breastfeeding is convenient. Breast milk costs nothing and is always available at the correct temperature.  Breastfeeding helps to burn calories. It helps you to lose the weight that you gained during pregnancy.  Breastfeeding makes your uterus return faster to its size before pregnancy. It also slows bleeding (lochia) after you give birth.  Breastfeeding helps to lower your risk of developing type 2 diabetes, osteoporosis, rheumatoid arthritis, cardiovascular disease, and breast, ovarian, uterine, and endometrial cancer later in life. Breastfeeding basics Starting breastfeeding  Find a comfortable place to sit or lie down, with your neck and back well-supported.  Place a pillow or a rolled-up blanket under your baby to bring him or her to the level of your breast (if you are seated). Nursing pillows are specially designed to help support your arms and your baby while you breastfeed.  Make sure that your baby's tummy (abdomen) is facing your abdomen.  Gently massage your breast. With your fingertips, massage from the outer edges of your breast inward toward the nipple. This encourages milk flow. If your milk flows slowly, you may need to continue this action during the feeding.  Support your breast with 4 fingers underneath and your thumb above your nipple (make the letter "C" with your hand). Make sure your fingers are well away from your nipple and your baby's mouth.  Stroke your baby's lips gently with your finger or nipple.  When your baby's mouth is open   wide enough, quickly bring your baby to your breast, placing your entire nipple and as much of the areola as possible into your baby's mouth. The areola is the colored area around your nipple. ? More areola should be visible above your baby's upper lip than below the lower lip. ? Your baby's lips should be opened and extended outward (flanged) to  ensure an adequate, comfortable latch. ? Your baby's tongue should be between his or her lower gum and your breast.  Make sure that your baby's mouth is correctly positioned around your nipple (latched). Your baby's lips should create a seal on your breast and be turned out (everted).  It is common for your baby to suck about 2-3 minutes in order to start the flow of breast milk. Latching Teaching your baby how to latch onto your breast properly is very important. An improper latch can cause nipple pain, decreased milk supply, and poor weight gain in your baby. Also, if your baby is not latched onto your nipple properly, he or she may swallow some air during feeding. This can make your baby fussy. Burping your baby when you switch breasts during the feeding can help to get rid of the air. However, teaching your baby to latch on properly is still the best way to prevent fussiness from swallowing air while breastfeeding. Signs that your baby has successfully latched onto your nipple  Silent tugging or silent sucking, without causing you pain. Infant's lips should be extended outward (flanged).  Swallowing heard between every 3-4 sucks once your milk has started to flow (after your let-down milk reflex occurs).  Muscle movement above and in front of his or her ears while sucking. Signs that your baby has not successfully latched onto your nipple  Sucking sounds or smacking sounds from your baby while breastfeeding.  Nipple pain. If you think your baby has not latched on correctly, slip your finger into the corner of your baby's mouth to break the suction and place it between your baby's gums. Attempt to start breastfeeding again. Signs of successful breastfeeding Signs from your baby  Your baby will gradually decrease the number of sucks or will completely stop sucking.  Your baby will fall asleep.  Your baby's body will relax.  Your baby will retain a small amount of milk in his or her  mouth.  Your baby will let go of your breast by himself or herself. Signs from you  Breasts that have increased in firmness, weight, and size 1-3 hours after feeding.  Breasts that are softer immediately after breastfeeding.  Increased milk volume, as well as a change in milk consistency and color by the fifth day of breastfeeding.  Nipples that are not sore, cracked, or bleeding. Signs that your baby is getting enough milk  Wetting at least 1-2 diapers during the first 24 hours after birth.  Wetting at least 5-6 diapers every 24 hours for the first week after birth. The urine should be clear or pale yellow by the age of 5 days.  Wetting 6-8 diapers every 24 hours as your baby continues to grow and develop.  At least 3 stools in a 24-hour period by the age of 5 days. The stool should be soft and yellow.  At least 3 stools in a 24-hour period by the age of 7 days. The stool should be seedy and yellow.  No loss of weight greater than 10% of birth weight during the first 3 days of life.  Average weight gain   of 4-7 oz (113-198 g) per week after the age of 4 days.  Consistent daily weight gain by the age of 5 days, without weight loss after the age of 2 weeks. After a feeding, your baby may spit up a small amount of milk. This is normal. Breastfeeding frequency and duration Frequent feeding will help you make more milk and can prevent sore nipples and extremely full breasts (breast engorgement). Breastfeed when you feel the need to reduce the fullness of your breasts or when your baby shows signs of hunger. This is called "breastfeeding on demand." Signs that your baby is hungry include:  Increased alertness, activity, or restlessness.  Movement of the head from side to side.  Opening of the mouth when the corner of the mouth or cheek is stroked (rooting).  Increased sucking sounds, smacking lips, cooing, sighing, or squeaking.  Hand-to-mouth movements and sucking on fingers or  hands.  Fussing or crying. Avoid introducing a pacifier to your baby in the first 4-6 weeks after your baby is born. After this time, you may choose to use a pacifier. Research has shown that pacifier use during the first year of a baby's life decreases the risk of sudden infant death syndrome (SIDS). Allow your baby to feed on each breast as long as he or she wants. When your baby unlatches or falls asleep while feeding from the first breast, offer the second breast. Because newborns are often sleepy in the first few weeks of life, you may need to awaken your baby to get him or her to feed. Breastfeeding times will vary from baby to baby. However, the following rules can serve as a guide to help you make sure that your baby is properly fed:  Newborns (babies 4 weeks of age or younger) may breastfeed every 1-3 hours.  Newborns should not go without breastfeeding for longer than 3 hours during the day or 5 hours during the night.  You should breastfeed your baby a minimum of 8 times in a 24-hour period. Breast milk pumping     Pumping and storing breast milk allows you to make sure that your baby is exclusively fed your breast milk, even at times when you are unable to breastfeed. This is especially important if you go back to work while you are still breastfeeding, or if you are not able to be present during feedings. Your lactation consultant can help you find a method of pumping that works best for you and give you guidelines about how long it is safe to store breast milk. Caring for your breasts while you breastfeed Nipples can become dry, cracked, and sore while breastfeeding. The following recommendations can help keep your breasts moisturized and healthy:  Avoid using soap on your nipples.  Wear a supportive bra designed especially for nursing. Avoid wearing underwire-style bras or extremely tight bras (sports bras).  Air-dry your nipples for 3-4 minutes after each feeding.  Use only  cotton bra pads to absorb leaked breast milk. Leaking of breast milk between feedings is normal.  Use lanolin on your nipples after breastfeeding. Lanolin helps to maintain your skin's normal moisture barrier. Pure lanolin is not harmful (not toxic) to your baby. You may also hand express a few drops of breast milk and gently massage that milk into your nipples and allow the milk to air-dry. In the first few weeks after giving birth, some women experience breast engorgement. Engorgement can make your breasts feel heavy, warm, and tender to the touch. Engorgement   peaks within 3-5 days after you give birth. The following recommendations can help to ease engorgement:  Completely empty your breasts while breastfeeding or pumping. You may want to start by applying warm, moist heat (in the shower or with warm, water-soaked hand towels) just before feeding or pumping. This increases circulation and helps the milk flow. If your baby does not completely empty your breasts while breastfeeding, pump any extra milk after he or she is finished.  Apply ice packs to your breasts immediately after breastfeeding or pumping, unless this is too uncomfortable for you. To do this: ? Put ice in a plastic bag. ? Place a towel between your skin and the bag. ? Leave the ice on for 20 minutes, 2-3 times a day.  Make sure that your baby is latched on and positioned properly while breastfeeding. If engorgement persists after 48 hours of following these recommendations, contact your health care provider or a lactation consultant. Overall health care recommendations while breastfeeding  Eat 3 healthy meals and 3 snacks every day. Well-nourished mothers who are breastfeeding need an additional 450-500 calories a day. You can meet this requirement by increasing the amount of a balanced diet that you eat.  Drink enough water to keep your urine pale yellow or clear.  Rest often, relax, and continue to take your prenatal vitamins  to prevent fatigue, stress, and low vitamin and mineral levels in your body (nutrient deficiencies).  Do not use any products that contain nicotine or tobacco, such as cigarettes and e-cigarettes. Your baby may be harmed by chemicals from cigarettes that pass into breast milk and exposure to secondhand smoke. If you need help quitting, ask your health care provider.  Avoid alcohol.  Do not use illegal drugs or marijuana.  Talk with your health care provider before taking any medicines. These include over-the-counter and prescription medicines as well as vitamins and herbal supplements. Some medicines that may be harmful to your baby can pass through breast milk.  It is possible to become pregnant while breastfeeding. If birth control is desired, ask your health care provider about options that will be safe while breastfeeding your baby. Where to find more information: [employer]: www.llli.org Contact a health care provider if:  You feel like you want to stop breastfeeding or have become frustrated with breastfeeding.  Your nipples are cracked or bleeding.  Your breasts are red, tender, or warm.  You have: ? Painful breasts or nipples. ? A swollen area on either breast. ? A fever or chills. ? Nausea or vomiting. ? Drainage other than breast milk from your nipples.  Your breasts do not become full before feedings by the fifth day after you give birth.  You feel sad and depressed.  Your baby is: ? Too sleepy to eat well. ? Having trouble sleeping. ? More than 1 week old and wetting fewer than 6 diapers in a 24-hour period. ? Not gaining weight by 5 days of age.  Your baby has fewer than 3 stools in a 24-hour period.  Your baby's skin or the white parts of his or her eyes become yellow. Get help right away if:  Your baby is overly tired (lethargic) and does not want to wake up and feed.  Your baby develops an unexplained fever. Summary  Breastfeeding  offers many health benefits for infant and mothers.  Try to breastfeed your infant when he or she shows early signs of hunger.  Gently tickle or stroke your baby's lips with   your finger or nipple to allow the baby to open his or her mouth. Bring the baby to your breast. Make sure that much of the areola is in your baby's mouth. Offer one side and burp the baby before you offer the other side.  Talk with your health care provider or lactation consultant if you have questions or you face problems as you breastfeed. This information is not intended to replace advice given to you by your health care provider. Make sure you discuss any questions you have with your health care provider. Document Revised: 05/07/2017 Document Reviewed: 03/14/2016 Elsevier Patient Education  2020 [employer]  

## 2020-02-09 NOTE — Progress Notes (Signed)
   Subjective:  Adelee Hannula is a 19 y.o. G1P0 at [redacted]w[redacted]d being seen today for ongoing prenatal care.  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  Patient reports no complaints.  Contractions: Irritability. Vag. Bleeding: None.  Movement: Present. Denies leaking of fluid.   The following portions of the patient's history were reviewed and updated as appropriate: allergies, current medications, past family history, past medical history, past social history, past surgical history and problem list. Problem list updated.  Objective:   Vitals:   02/09/20 1132  BP: 114/60  Pulse: 78  Weight: 194 lb 11.2 oz (88.3 kg)    Fetal Status: Fetal Heart Rate (bpm): 155 Fundal Height: 30 cm Movement: Present     General:  Alert, oriented and cooperative. Patient is in no acute distress.  Skin: Skin is warm and dry. No rash noted.   Cardiovascular: Normal heart rate noted  Respiratory: Normal respiratory effort, no problems with respiration noted  Abdomen: Soft, gravid, appropriate for gestational age. Pain/Pressure: Present     Pelvic: Vag. Bleeding: None     Cervical exam deferred        Extremities: Normal range of motion.  Edema: None  Mental Status: Normal mood and affect. Normal behavior. Normal judgment and thought content.   Urinalysis:      Assessment and Plan:  Pregnancy: G1P0 at [redacted]w[redacted]d  1. Supervision of high risk pregnancy, antepartum Reviewed structure of Faculty practice with patient, that they may not always see the same provider, and that physicians, fellows, CNM, NP, PA, residents, and medical students may all be involved in their care. New OB labs already done Unplanned pregnancy Discussed  contraception, considering LARC, will review [email or web address] Accepts genetic screening today Anatomy scan ordered - Blood Pressure Monitoring (BLOOD PRESSURE KIT) DEVI; 1 Device by Does not apply route once for 1 dose.  Dispense: 1 each; Refill: 0 - Genetic Screening - US MFM OB DETAIL +14 WK; Future  2. Psychosocial stressors Significant stress around pregnancy, referred - Ambulatory referral to [place]  3. Sickle cell disease without crisis ([place]) Not on any medications Has not had pain crisis since she was 7  4. Late prenatal care affecting pregnancy, antepartum   5. GBS bacteriuria S/p treatment Prophylaxis in labor  6. Functional asplenia In chart though no documentation regarding this diagnosis Has had Pneumo-23 vaccine  7. Chlamydia infection affecting pregnancy in second trimester S/p treatment, TOC at next visit  8. Positive depression screening - Ambulatory referral to [place]  Preterm labor symptoms and general obstetric precautions including but not limited to vaginal bleeding, contractions, leaking of fluid and fetal movement were reviewed in detail with the patient. Please refer to After Visit Summary for other counseling recommendations.  Return in 2 weeks (on 02/23/2020) for [facility], ob visit.   Clarnce Flock, MD

## 2020-02-09 NOTE — Telephone Encounter (Signed)
Called [facility] and they report there was a system wide error that transportation was cancelled. They can pick her up at 9:59 and then they will transport her here. She will arrive at 10:15.   Informed front desk and Dr. Crissie Reese.

## 2020-02-10 ENCOUNTER — Encounter: Payer: Self-pay | Admitting: *Deleted

## 2020-02-23 ENCOUNTER — Other Ambulatory Visit: Payer: Self-pay

## 2020-02-23 ENCOUNTER — Ambulatory Visit (INDEPENDENT_AMBULATORY_CARE_PROVIDER_SITE_OTHER): Payer: Medicaid Other | Admitting: Family Medicine

## 2020-02-23 ENCOUNTER — Encounter: Payer: Self-pay | Admitting: Family Medicine

## 2020-02-23 VITALS — BP 117/69 | HR 84 | Wt 201.1 lb

## 2020-02-23 DIAGNOSIS — A749 Chlamydial infection, unspecified: Secondary | ICD-10-CM

## 2020-02-23 DIAGNOSIS — Z23 Encounter for immunization: Secondary | ICD-10-CM

## 2020-02-23 DIAGNOSIS — O0930 Supervision of pregnancy with insufficient antenatal care, unspecified trimester: Secondary | ICD-10-CM

## 2020-02-23 DIAGNOSIS — R8271 Bacteriuria: Secondary | ICD-10-CM

## 2020-02-23 DIAGNOSIS — D571 Sickle-cell disease without crisis: Secondary | ICD-10-CM

## 2020-02-23 DIAGNOSIS — O98812 Other maternal infectious and parasitic diseases complicating pregnancy, second trimester: Secondary | ICD-10-CM | POA: Diagnosis not present

## 2020-02-23 DIAGNOSIS — O0990 Supervision of high risk pregnancy, unspecified, unspecified trimester: Secondary | ICD-10-CM | POA: Diagnosis not present

## 2020-02-23 DIAGNOSIS — Q8901 Asplenia (congenital): Secondary | ICD-10-CM

## 2020-02-23 MED ORDER — AZITHROMYCIN 500 MG PO TABS
1000.0000 mg | ORAL_TABLET | Freq: Once | ORAL | Status: AC
  Administered 2020-02-23: 1000 mg via ORAL

## 2020-02-23 NOTE — Patient Instructions (Addendum)
If you are in need of transportation to get to and from your appointments in our office.  You can reach Transportation Services by calling [phone] Monday - Friday  7am-6pm.   Contraception Choices Contraception, also called birth control, refers to methods or devices that prevent pregnancy. Hormonal methods Contraceptive implant  A contraceptive implant is a thin, plastic tube that contains a hormone. It is inserted into the upper part of the arm. It can remain in place for up to 3 years. Progestin-only injections Progestin-only injections are injections of progestin, a synthetic form of the hormone progesterone. They are given every 3 months by a health care provider. Birth control pills  Birth control pills are pills that contain hormones that prevent pregnancy. They must be taken once a day, preferably at the same time each day. Birth control patch  The birth control patch contains hormones that prevent pregnancy. It is placed on the skin and must be changed once a week for three weeks and removed on the fourth week. A prescription is needed to use this method of contraception. Vaginal ring  A vaginal ring contains hormones that prevent pregnancy. It is placed in the vagina for three weeks and removed on the fourth week. After that, the process is repeated with a new ring. A prescription is needed to use this method of contraception. Emergency contraceptive Emergency contraceptives prevent pregnancy after unprotected sex. They come in pill form and can be taken up to 5 days after sex. They work best the sooner they are taken after having sex. Most emergency contraceptives are available without a prescription. This method should not be used as your only form of birth control. Barrier methods Female condom  A female condom is a thin sheath that is worn over the penis during sex. Condoms keep sperm from going inside a woman's body. They can be used with a spermicide to increase their  effectiveness. They should be disposed after a single use. Female condom  A female condom is a soft, loose-fitting sheath that is put into the vagina before sex. The condom keeps sperm from going inside a woman's body. They should be disposed after a single use. Diaphragm  A diaphragm is a soft, dome-shaped barrier. It is inserted into the vagina before sex, along with a spermicide. The diaphragm blocks sperm from entering the uterus, and the spermicide kills sperm. A diaphragm should be left in the vagina for 6-8 hours after sex and removed within 24 hours. A diaphragm is prescribed and fitted by a health care provider. A diaphragm should be replaced every 1-2 years, after giving birth, after gaining more than 15 lb (6.8 kg), and after pelvic surgery. Cervical cap  A cervical cap is a round, soft latex or plastic cup that fits over the cervix. It is inserted into the vagina before sex, along with spermicide. It blocks sperm from entering the uterus. The cap should be left in place for 6-8 hours after sex and removed within 48 hours. A cervical cap must be prescribed and fitted by a health care provider. It should be replaced every 2 years. Sponge  A sponge is a soft, circular piece of polyurethane foam with spermicide on it. The sponge helps block sperm from entering the uterus, and the spermicide kills sperm. To use it, you make it wet and then insert it into the vagina. It should be inserted before sex, left in for at least 6 hours after sex, and removed and thrown away within 30 hours.  Spermicides Spermicides are chemicals that kill or block sperm from entering the cervix and uterus. They can come as a cream, jelly, suppository, foam, or tablet. A spermicide should be inserted into the vagina with an applicator at least 10-15 minutes before sex to allow time for it to work. The process must be repeated every time you have sex. Spermicides do not require a prescription. Intrauterine  contraception Intrauterine device (IUD) An IUD is a T-shaped device that is put in a woman's uterus. There are two types:  Hormone IUD.This type contains progestin, a synthetic form of the hormone progesterone. This type can stay in place for 3-5 years.  Copper IUD.This type is wrapped in copper wire. It can stay in place for 10 years.  Permanent methods of contraception Female tubal ligation In this method, a woman's fallopian tubes are sealed, tied, or blocked during surgery to prevent eggs from traveling to the uterus. Hysteroscopic sterilization In this method, a small, flexible insert is placed into each fallopian tube. The inserts cause scar tissue to form in the fallopian tubes and block them, so sperm cannot reach an egg. The procedure takes about 3 months to be effective. Another form of birth control must be used during those 3 months. Female sterilization This is a procedure to tie off the tubes that carry sperm (vasectomy). After the procedure, the man can still ejaculate fluid (semen). Natural planning methods Natural family planning In this method, a couple does not have sex on days when the woman could become pregnant. Calendar method This means keeping track of the length of each menstrual cycle, identifying the days when pregnancy can happen, and not having sex on those days. Ovulation method In this method, a couple avoids sex during ovulation. Symptothermal method This method involves not having sex during ovulation. The woman typically checks for ovulation by watching changes in her temperature and in the consistency of cervical mucus. Post-ovulation method In this method, a couple waits to have sex until after ovulation. Summary  Contraception, also called birth control, means methods or devices that prevent pregnancy.  Hormonal methods of contraception include implants, injections, pills, patches, vaginal rings, and emergency contraceptives.  Barrier methods of  contraception can include female condoms, female condoms, diaphragms, cervical caps, sponges, and spermicides.  There are two types of IUDs (intrauterine devices). An IUD can be put in a woman's uterus to prevent pregnancy for 3-5 years.  Permanent sterilization can be done through a procedure for males, females, or both.  Natural family planning methods involve not having sex on days when the woman could become pregnant. This information is not intended to replace advice given to you by your health care provider. Make sure you discuss any questions you have with your health care provider. Document Revised: 02/12/2017 Document Reviewed: 03/15/2016 Elsevier Patient Education  2020 [employer].   Breastfeeding  Choosing to breastfeed is one of the best decisions you can make for yourself and your baby. A change in hormones during pregnancy causes your breasts to make breast milk in your milk-producing glands. Hormones prevent breast milk from being released before your baby is born. They also prompt milk flow after birth. Once breastfeeding has begun, thoughts of your baby, as well as his or her sucking or crying, can stimulate the release of milk from your milk-producing glands. Benefits of breastfeeding Research shows that breastfeeding offers many health benefits for infants and mothers. It also offers a cost-free and convenient way to feed your baby. For your  baby  Your first milk (colostrum) helps your baby's digestive system to function better.  Special cells in your milk (antibodies) help your baby to fight off infections.  Breastfed babies are less likely to develop asthma, allergies, obesity, or type 2 diabetes. They are also at lower risk for sudden infant death syndrome (SIDS).  Nutrients in breast milk are better able to meet your babys needs compared to infant formula.  Breast milk improves your baby's brain development. For you  Breastfeeding helps to create a very special  bond between you and your baby.  Breastfeeding is convenient. Breast milk costs nothing and is always available at the correct temperature.  Breastfeeding helps to burn calories. It helps you to lose the weight that you gained during pregnancy.  Breastfeeding makes your uterus return faster to its size before pregnancy. It also slows bleeding (lochia) after you give birth.  Breastfeeding helps to lower your risk of developing type 2 diabetes, osteoporosis, rheumatoid arthritis, cardiovascular disease, and breast, ovarian, uterine, and endometrial cancer later in life. Breastfeeding basics Starting breastfeeding  Find a comfortable place to sit or lie down, with your neck and back well-supported.  Place a pillow or a rolled-up blanket under your baby to bring him or her to the level of your breast (if you are seated). Nursing pillows are specially designed to help support your arms and your baby while you breastfeed.  Make sure that your baby's tummy (abdomen) is facing your abdomen.  Gently massage your breast. With your fingertips, massage from the outer edges of your breast inward toward the nipple. This encourages milk flow. If your milk flows slowly, you may need to continue this action during the feeding.  Support your breast with 4 fingers underneath and your thumb above your nipple (make the letter "C" with your hand). Make sure your fingers are well away from your nipple and your babys mouth.  Stroke your baby's lips gently with your finger or nipple.  When your baby's mouth is open wide enough, quickly bring your baby to your breast, placing your entire nipple and as much of the areola as possible into your baby's mouth. The areola is the colored area around your nipple. ? More areola should be visible above your baby's upper lip than below the lower lip. ? Your baby's lips should be opened and extended outward (flanged) to ensure an adequate, comfortable latch. ? Your baby's  tongue should be between his or her lower gum and your breast.  Make sure that your baby's mouth is correctly positioned around your nipple (latched). Your baby's lips should create a seal on your breast and be turned out (everted).  It is common for your baby to suck about 2-3 minutes in order to start the flow of breast milk. Latching Teaching your baby how to latch onto your breast properly is very important. An improper latch can cause nipple pain, decreased milk supply, and poor weight gain in your baby. Also, if your baby is not latched onto your nipple properly, he or she may swallow some air during feeding. This can make your baby fussy. Burping your baby when you switch breasts during the feeding can help to get rid of the air. However, teaching your baby to latch on properly is still the best way to prevent fussiness from swallowing air while breastfeeding. Signs that your baby has successfully latched onto your nipple  Silent tugging or silent sucking, without causing you pain. Infant's lips should be extended outward (  flanged).  Swallowing heard between every 3-4 sucks once your milk has started to flow (after your let-down milk reflex occurs).  Muscle movement above and in front of his or her ears while sucking. Signs that your baby has not successfully latched onto your nipple  Sucking sounds or smacking sounds from your baby while breastfeeding.  Nipple pain. If you think your baby has not latched on correctly, slip your finger into the corner of your babys mouth to break the suction and place it between your baby's gums. Attempt to start breastfeeding again. Signs of successful breastfeeding Signs from your baby  Your baby will gradually decrease the number of sucks or will completely stop sucking.  Your baby will fall asleep.  Your baby's body will relax.  Your baby will retain a small amount of milk in his or her mouth.  Your baby will let go of your breast by  himself or herself. Signs from you  Breasts that have increased in firmness, weight, and size 1-3 hours after feeding.  Breasts that are softer immediately after breastfeeding.  Increased milk volume, as well as a change in milk consistency and color by the fifth day of breastfeeding.  Nipples that are not sore, cracked, or bleeding. Signs that your baby is getting enough milk  Wetting at least 1-2 diapers during the first 24 hours after birth.  Wetting at least 5-6 diapers every 24 hours for the first week after birth. The urine should be clear or pale yellow by the age of 5 days.  Wetting 6-8 diapers every 24 hours as your baby continues to grow and develop.  At least 3 stools in a 24-hour period by the age of 5 days. The stool should be soft and yellow.  At least 3 stools in a 24-hour period by the age of 7 days. The stool should be seedy and yellow.  No loss of weight greater than 10% of birth weight during the first 3 days of life.  Average weight gain of 4-7 oz (113-198 g) per week after the age of 4 days.  Consistent daily weight gain by the age of 5 days, without weight loss after the age of 2 weeks. After a feeding, your baby may spit up a small amount of milk. This is normal. Breastfeeding frequency and duration Frequent feeding will help you make more milk and can prevent sore nipples and extremely full breasts (breast engorgement). Breastfeed when you feel the need to reduce the fullness of your breasts or when your baby shows signs of hunger. This is called "breastfeeding on demand." Signs that your baby is hungry include:  Increased alertness, activity, or restlessness.  Movement of the head from side to side.  Opening of the mouth when the corner of the mouth or cheek is stroked (rooting).  Increased sucking sounds, smacking lips, cooing, sighing, or squeaking.  Hand-to-mouth movements and sucking on fingers or hands.  Fussing or crying. Avoid introducing a  pacifier to your baby in the first 4-6 weeks after your baby is born. After this time, you may choose to use a pacifier. Research has shown that pacifier use during the first year of a baby's life decreases the risk of sudden infant death syndrome (SIDS). Allow your baby to feed on each breast as long as he or she wants. When your baby unlatches or falls asleep while feeding from the first breast, offer the second breast. Because newborns are often sleepy in the first few weeks of life,  you may need to awaken your baby to get him or her to feed. Breastfeeding times will vary from baby to baby. However, the following rules can serve as a guide to help you make sure that your baby is properly fed:  Newborns (babies 294 weeks of age or younger) may breastfeed every 1-3 hours.  Newborns should not go without breastfeeding for longer than 3 hours during the day or 5 hours during the night.  You should breastfeed your baby a minimum of 8 times in a 24-hour period. Breast milk pumping     Pumping and storing breast milk allows you to make sure that your baby is exclusively fed your breast milk, even at times when you are unable to breastfeed. This is especially important if you go back to work while you are still breastfeeding, or if you are not able to be present during feedings. Your lactation consultant can help you find a method of pumping that works best for you and give you guidelines about how long it is safe to store breast milk. Caring for your breasts while you breastfeed Nipples can become dry, cracked, and sore while breastfeeding. The following recommendations can help keep your breasts moisturized and healthy:  Avoid using soap on your nipples.  Wear a supportive bra designed especially for nursing. Avoid wearing underwire-style bras or extremely tight bras (sports bras).  Air-dry your nipples for 3-4 minutes after each feeding.  Use only cotton bra pads to absorb leaked breast milk.  Leaking of breast milk between feedings is normal.  Use lanolin on your nipples after breastfeeding. Lanolin helps to maintain your skin's normal moisture barrier. Pure lanolin is not harmful (not toxic) to your baby. You may also hand express a few drops of breast milk and gently massage that milk into your nipples and allow the milk to air-dry. In the first few weeks after giving birth, some women experience breast engorgement. Engorgement can make your breasts feel heavy, warm, and tender to the touch. Engorgement peaks within 3-5 days after you give birth. The following recommendations can help to ease engorgement:  Completely empty your breasts while breastfeeding or pumping. You may want to start by applying warm, moist heat (in the shower or with warm, water-soaked hand towels) just before feeding or pumping. This increases circulation and helps the milk flow. If your baby does not completely empty your breasts while breastfeeding, pump any extra milk after he or she is finished.  Apply ice packs to your breasts immediately after breastfeeding or pumping, unless this is too uncomfortable for you. To do this: ? Put ice in a plastic bag. ? Place a towel between your skin and the bag. ? Leave the ice on for 20 minutes, 2-3 times a day.  Make sure that your baby is latched on and positioned properly while breastfeeding. If engorgement persists after 48 hours of following these recommendations, contact your health care provider or a Advertising copywriterlactation consultant. Overall health care recommendations while breastfeeding  Eat 3 healthy meals and 3 snacks every day. Well-nourished mothers who are breastfeeding need an additional 450-500 calories a day. You can meet this requirement by increasing the amount of a balanced diet that you eat.  Drink enough water to keep your urine pale yellow or clear.  Rest often, relax, and continue to take your prenatal vitamins to prevent fatigue, stress, and low vitamin  and mineral levels in your body (nutrient deficiencies).  Do not use any products that contain nicotine or  tobacco, such as cigarettes and e-cigarettes. Your baby may be harmed by chemicals from cigarettes that pass into breast milk and exposure to secondhand smoke. If you need help quitting, ask your health care provider.  Avoid alcohol.  Do not use illegal drugs or marijuana.  Talk with your health care provider before taking any medicines. These include over-the-counter and prescription medicines as well as vitamins and herbal supplements. Some medicines that may be harmful to your baby can pass through breast milk.  It is possible to become pregnant while breastfeeding. If birth control is desired, ask your health care provider about options that will be safe while breastfeeding your baby. Where to find more information: [employer]: www.llli.org Contact a health care provider if:  You feel like you want to stop breastfeeding or have become frustrated with breastfeeding.  Your nipples are cracked or bleeding.  Your breasts are red, tender, or warm.  You have: ? Painful breasts or nipples. ? A swollen area on either breast. ? A fever or chills. ? Nausea or vomiting. ? Drainage other than breast milk from your nipples.  Your breasts do not become full before feedings by the fifth day after you give birth.  You feel sad and depressed.  Your baby is: ? Too sleepy to eat well. ? Having trouble sleeping. ? More than 32 week old and wetting fewer than 6 diapers in a 24-hour period. ? Not gaining weight by 26 days of age.  Your baby has fewer than 3 stools in a 24-hour period.  Your baby's skin or the white parts of his or her eyes become yellow. Get help right away if:  Your baby is overly tired (lethargic) and does not want to wake up and feed.  Your baby develops an unexplained fever. Summary  Breastfeeding offers many health benefits for infant and  mothers.  Try to breastfeed your infant when he or she shows early signs of hunger.  Gently tickle or stroke your baby's lips with your finger or nipple to allow the baby to open his or her mouth. Bring the baby to your breast. Make sure that much of the areola is in your baby's mouth. Offer one side and burp the baby before you offer the other side.  Talk with your health care provider or lactation consultant if you have questions or you face problems as you breastfeed. This information is not intended to replace advice given to you by your health care provider. Make sure you discuss any questions you have with your health care provider. Document Revised: 05/07/2017 Document Reviewed: 03/14/2016 Elsevier Patient Education  2020 [employer].

## 2020-02-23 NOTE — Progress Notes (Signed)
   Subjective:  Amanda Burch is a 19 y.o. G1P0 at [redacted]w[redacted]d being seen today for ongoing prenatal care.  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  Patient reports no complaints.  Contractions: Irritability. Vag. Bleeding: None.  Movement: Present. Denies leaking of fluid.   Reports she had intercourse with her boyfriend who has not yet gotten treatment for chlamydia Used a condom but it broke Took prior treatment for chlamydia without issue  The following portions of the patient's history were reviewed and updated as appropriate: allergies, current medications, past family history, past medical history, past social history, past surgical history and problem list. Problem list updated.  Objective:   Vitals:   02/23/20 1109  BP: 117/69  Pulse: 84  Weight: 201 lb 1.6 oz (91.2 kg)    Fetal Status: Fetal Heart Rate (bpm): 149   Movement: Present     General:  Alert, oriented and cooperative. Patient is in no acute distress.  Skin: Skin is warm and dry. No rash noted.   Cardiovascular: Normal heart rate noted  Respiratory: Normal respiratory effort, no problems with respiration noted  Abdomen: Soft, gravid, appropriate for gestational age. Pain/Pressure: Present     Pelvic: Vag. Bleeding: None     Cervical exam deferred        Extremities: Normal range of motion.  Edema: None  Mental Status: Normal mood and affect. Normal behavior. Normal judgment and thought content.   Urinalysis:      Assessment and Plan:  Pregnancy: G1P0 at [redacted]w[redacted]d  1. Supervision of high risk pregnancy, antepartum BP and FHR normal Still considering LARC Many questions regarding birth process and L&D answered Has f/u anatomy scan  scheduled for 03/09/2019  2. Sickle cell disease without crisis (HCC) No meds, no crises since age 59  3. Late prenatal care affecting pregnancy, antepartum   4. Chlamydia infection affecting pregnancy in second trimester Re-exposed to untreated partner Given dose of 1g Azithromycin by RN Counseled to have no intercourse until both she and partner are at least 1 week post-treatment  5. Functional asplenia   6. GBS bacteriuria   Preterm labor symptoms and general obstetric precautions including but not limited to vaginal bleeding, contractions, leaking of fluid and fetal movement were reviewed in detail with the patient. Please refer to After Visit Summary for other counseling recommendations.  Return in 2 weeks (on 03/08/2020) for [facility], ob visit.   Venora Maples, MD

## 2020-03-08 ENCOUNTER — Encounter: Payer: Self-pay | Admitting: *Deleted

## 2020-03-08 ENCOUNTER — Ambulatory Visit: Payer: Medicaid Other | Attending: Family Medicine

## 2020-03-08 ENCOUNTER — Telehealth: Payer: Self-pay

## 2020-03-08 ENCOUNTER — Ambulatory Visit (INDEPENDENT_AMBULATORY_CARE_PROVIDER_SITE_OTHER): Payer: Medicaid Other | Admitting: Obstetrics and Gynecology

## 2020-03-08 ENCOUNTER — Ambulatory Visit: Payer: Self-pay | Admitting: Clinical

## 2020-03-08 ENCOUNTER — Ambulatory Visit: Payer: Medicaid Other | Admitting: *Deleted

## 2020-03-08 ENCOUNTER — Other Ambulatory Visit: Payer: Self-pay

## 2020-03-08 ENCOUNTER — Other Ambulatory Visit: Payer: Self-pay | Admitting: Family Medicine

## 2020-03-08 ENCOUNTER — Encounter: Payer: Self-pay | Admitting: Obstetrics and Gynecology

## 2020-03-08 VITALS — BP 105/61 | HR 78 | Wt 207.3 lb

## 2020-03-08 DIAGNOSIS — O0990 Supervision of high risk pregnancy, unspecified, unspecified trimester: Secondary | ICD-10-CM | POA: Diagnosis not present

## 2020-03-08 DIAGNOSIS — R8271 Bacteriuria: Secondary | ICD-10-CM

## 2020-03-08 DIAGNOSIS — Z1331 Encounter for screening for depression: Secondary | ICD-10-CM

## 2020-03-08 DIAGNOSIS — O98812 Other maternal infectious and parasitic diseases complicating pregnancy, second trimester: Secondary | ICD-10-CM

## 2020-03-08 DIAGNOSIS — A749 Chlamydial infection, unspecified: Secondary | ICD-10-CM

## 2020-03-08 DIAGNOSIS — D571 Sickle-cell disease without crisis: Secondary | ICD-10-CM

## 2020-03-08 NOTE — Telephone Encounter (Signed)
Pt had positive depression screening, wanted to see [facility], was told that she could do virtual today at 3:45. Pt advised could not do today due to work. Called to reschedule, no answer, left message for call back.

## 2020-03-08 NOTE — [facility] Specialist Note (Signed)
Error

## 2020-03-08 NOTE — Patient Instructions (Signed)

## 2020-03-08 NOTE — Addendum Note (Signed)
Addended by: Henrietta Dine on: 03/08/2020 11:29 AM   Modules accepted: Orders

## 2020-03-08 NOTE — Progress Notes (Signed)
Subjective:  Amanda Burch is a 20 y.o. G1P0 at [redacted]w[redacted]d being seen today for ongoing prenatal care.  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  Patient reports general discomforts of pregnancy.  Contractions: Not present. Vag. Bleeding: None.  Movement: Present. Denies leaking of fluid.   The following portions of the patient's history were reviewed and updated as appropriate: allergies, current medications, past family history, past medical history, past social history, past surgical history and problem list. Problem list updated.  Objective:   Vitals:   03/08/20 1030  BP: 105/61  Pulse: 78  Weight: 207 lb 4.8 oz (94 kg)    Fetal Status: Fetal Heart Rate (bpm): 158   Movement: Present     General:  Alert, oriented and cooperative. Patient is in no acute distress.  Skin: Skin is warm and dry. No rash noted.   Cardiovascular: Normal heart rate noted  Respiratory: Normal respiratory effort, no problems with respiration noted  Abdomen: Soft, gravid, appropriate for gestational age. Pain/Pressure: Present     Pelvic:  Cervical exam deferred        Extremities: Normal range of motion.  Edema: Trace  Mental Status: Normal mood and affect. Normal behavior. Normal judgment and thought content.   Urinalysis:      Assessment and Plan:  Pregnancy: G1P0 at [redacted]w[redacted]d  1. Supervision of high risk pregnancy, antepartum Stable  2. GBS bacteriuria Tx while in labor  3. Sickle cell disease without crisis (HCC) Stable Growth scan today, 19 % Weekly antenatal testing as per MFM  4. Chlamydia infection affecting pregnancy in second trimester TOC with next visit  Preterm labor symptoms and general obstetric  precautions including but not limited to vaginal bleeding, contractions, leaking of fluid and fetal movement were reviewed in detail with the patient. Please refer to After Visit Summary for other counseling recommendations.  Return in about 2 weeks (around 03/22/2020) for OB visit, face to face, any provider.   Hermina Staggers, MD

## 2020-03-09 ENCOUNTER — Telehealth: Payer: Self-pay | Admitting: Licensed Clinical Social Worker

## 2020-03-09 NOTE — Telephone Encounter (Signed)
Called pt to complete contraception counseling. Left message for callback  

## 2020-03-13 NOTE — BH Specialist Note (Signed)
[facility] via Telemedicine Visit  03/13/2020 Amanda Burch [id number]  Pt did not arrive to video visit and did not answer the phone ; Left HIPPA-compliant message to call back [name] from [facility] [employer] [facility] at [phone] (main office) or [phone] (Kaziah Krizek's office).  ; left MyChart message for patient.

## 2020-03-22 ENCOUNTER — Other Ambulatory Visit: Payer: Self-pay

## 2020-03-22 ENCOUNTER — Ambulatory Visit (INDEPENDENT_AMBULATORY_CARE_PROVIDER_SITE_OTHER): Payer: Medicaid Other | Admitting: Obstetrics and Gynecology

## 2020-03-22 VITALS — BP 104/41 | HR 98 | Wt 211.2 lb

## 2020-03-22 DIAGNOSIS — O0933 Supervision of pregnancy with insufficient antenatal care, third trimester: Secondary | ICD-10-CM

## 2020-03-22 DIAGNOSIS — R8271 Bacteriuria: Secondary | ICD-10-CM

## 2020-03-22 DIAGNOSIS — D571 Sickle-cell disease without crisis: Secondary | ICD-10-CM

## 2020-03-22 DIAGNOSIS — Q8901 Asplenia (congenital): Secondary | ICD-10-CM

## 2020-03-22 DIAGNOSIS — O0990 Supervision of high risk pregnancy, unspecified, unspecified trimester: Secondary | ICD-10-CM

## 2020-03-22 DIAGNOSIS — Z3A35 35 weeks gestation of pregnancy: Secondary | ICD-10-CM | POA: Insufficient documentation

## 2020-03-22 MED ORDER — PREPLUS 27-1 MG PO TABS: 1.0000 | ORAL_TABLET | Freq: Every day | ORAL | 13 refills | Status: DC

## 2020-03-22 NOTE — Patient Instructions (Addendum)
Third Trimester of Pregnancy  The third trimester of pregnancy is from week 28 through week 40. This is also called months 7 through 9. This trimester is when your unborn baby (fetus) is growing very fast. At the end of the ninth month, the unborn baby is about 20 inches long. It weighs about 6-10 pounds. Body changes during your third trimester Your body continues to go through many changes during this time. The changes vary and generally return to normal after the baby is born. Physical changes  Your weight will continue to increase. You may gain 25-35 pounds (11-16 kg) by the end of the pregnancy. If you are underweight, you may gain 28-40 lb (about 13-18 kg). If you are overweight, you may gain 15-25 lb (about 7-11 kg).  You may start to get stretch marks on your hips, belly (abdomen), and breasts.  Your breasts will continue to grow and may hurt. A yellow fluid (colostrum) may leak from your breasts. This is the first milk you are making for your baby.  You may have changes in your hair.  Your belly button may stick out.  You may have more swelling in your hands, face, or ankles. Health changes  You may have heartburn.  You may have trouble pooping (constipation).  You may get hemorrhoids. These are swollen veins in the butt that can itch or get painful.  You may have swollen veins (varicose veins) in your legs.  You may have more body aches in the pelvis, back, or thighs.  You may have more tingling or numbness in your hands, arms, and legs. The skin on your belly may also feel numb.  You may feel short of breath as your womb (uterus) gets bigger. Other changes  You may pee (urinate) more often.  You may have more problems sleeping.  You may notice the unborn baby "dropping," or moving lower in your belly.  You may have more discharge coming from your vagina.  Your joints may feel loose, and you may have pain around your pelvic bone. Follow these instructions at  home: Medicines  Take over-the-counter and prescription medicines only as told by your doctor. Some medicines are not safe during pregnancy.  Take a prenatal vitamin that contains at least 600 micrograms (mcg) of folic acid. Eating and drinking  Eat healthy meals that include: ? Fresh fruits and vegetables. ? Whole grains. ? Good sources of protein, such as meat, eggs, or tofu. ? Low-fat dairy products.  Avoid raw meat and unpasteurized juice, milk, and cheese. These carry germs that can harm you and your baby.  Eat 4 or 5 small meals rather than 3 large meals a day.  You may need to take these actions to prevent or treat trouble pooping: ? Drink enough fluids to keep your pee (urine) pale yellow. ? Eat foods that are high in fiber. These include beans, whole grains, and fresh fruits and vegetables. ? Limit foods that are high in fat and sugar. These include fried or sweet foods. Activity  Exercise only as told by your doctor. Stop exercising if you start to have cramps in your womb.  Avoid heavy lifting.  Do not exercise if it is too hot or too humid, or if you are in a place of great height (high altitude).  If you choose to, you may have sex unless your doctor tells you not to. Relieving pain and discomfort  Take breaks often, and rest with your legs raised (elevated) if you have   leg cramps or low back pain.  Take warm water baths (sitz baths) to soothe pain or discomfort caused by hemorrhoids. Use hemorrhoid cream if your doctor approves.  Wear a good support bra if your breasts are tender.  If you develop bulging, swollen veins in your legs: ? Wear support hose as told by your doctor. ? Raise your feet for 15 minutes, 3-4 times a day. ? Limit salt in your food. Safety  Talk to your doctor before traveling far distances.  Do not use hot tubs, steam rooms, or saunas.  Wear your seat belt at all times when you are in a car.  Talk with your doctor if someone is  hurting you or yelling at you a lot. Preparing for your baby's arrival To prepare for the arrival of your baby:  Take prenatal classes.  Visit the hospital and tour the maternity area.  Buy a rear-facing car seat. Learn how to install it in your car.  Prepare the baby's room. Take out all pillows and stuffed animals from the baby's crib. General instructions  Avoid cat litter boxes and soil used by cats. These carry germs that can cause harm to the baby and can cause a loss of your baby by miscarriage or stillbirth.  Do not douche or use tampons. Do not use scented sanitary pads.  Do not smoke or use any products that contain nicotine or tobacco. If you need help quitting, ask your doctor.  Do not drink alcohol.  Do not use herbal medicines, illegal drugs, or medicines that were not approved by your doctor. Chemicals in these products can affect your baby.  Keep all follow-up visits. This is important. Where to find more information  American Pregnancy Association: americanpregnancy.org  American College of Obstetricians and Gynecologists: www.acog.org  Office on Women's Health: womenshealth.gov/pregnancy Contact a doctor if:  You have a fever.  You have mild cramps or pressure in your lower belly.  You have a nagging pain in your belly area.  You vomit, or you have watery poop (diarrhea).  You have bad-smelling fluid coming from your vagina.  You have pain when you pee, or your pee smells bad.  You have a headache that does not go away when you take medicine.  You have changes in how you see, or you see spots in front of your eyes. Get help right away if:  Your water breaks.  You have regular contractions that are less than 5 minutes apart.  You are spotting or bleeding from your vagina.  You have very bad belly cramps or pain.  You have trouble breathing.  You have chest pain.  You faint.  You have not felt the baby move for the amount of time told by  your doctor.  You have new or increased pain, swelling, or redness in an arm or leg. Summary  The third trimester is from week 28 through week 40 (months 7 through 9). This is the time when your unborn baby is growing very fast.  During this time, your discomfort may increase as you gain weight and as your baby grows.  Get ready for your baby to arrive by taking prenatal classes, buying a rear-facing car seat, and preparing the baby's room.  Get help right away if you are bleeding from your vagina, you have chest pain and trouble breathing, or you have not felt the baby move for the amount of time told by your doctor. This information is not intended to replace advice given   to you by your health care provider. Make sure you discuss any questions you have with your health care provider. Document Revised: 07/20/2019 Document Reviewed: 05/26/2019 Elsevier Patient Education  2021 Elsevier Inc.  Round Ligament Pain  The round ligament is a cord of muscle and tissue that helps support the uterus. It can become a source of pain during pregnancy if it becomes stretched or twisted as the baby grows. The pain usually begins in the second trimester (13-28 weeks) of pregnancy, and it can come and go until the baby is delivered. It is not a serious problem, and it does not cause harm to the baby. Round ligament pain is usually a short, sharp, and pinching pain, but it can also be a dull, lingering, and aching pain. The pain is felt in the lower side of the abdomen or in the groin. It usually starts deep in the groin and moves up to the outside of the hip area. The pain may occur when you:  Suddenly change position, such as quickly going from a sitting to standing position.  Roll over in bed.  Cough or sneeze.  Do physical activity. Follow these instructions at home:  Watch your condition for any changes.  When the pain starts, relax. Then try any of these methods to help with the pain: ? Sitting  down. ? Flexing your knees up to your abdomen. ? Lying on your side with one pillow under your abdomen and another pillow between your legs. ? Sitting in a warm bath for 15-20 minutes or until the pain goes away.  Take over-the-counter and prescription medicines only as told by your health care provider.  Move slowly when you sit down or stand up.  Avoid long walks if they cause pain.  Stop or reduce your physical activities if they cause pain.  Keep all follow-up visits as told by your health care provider. This is important.   Contact a health care provider if:  Your pain does not go away with treatment.  You feel pain in your back that you did not have before.  Your medicine is not helping. Get help right away if:  You have a fever or chills.  You develop uterine contractions.  You have vaginal bleeding.  You have nausea or vomiting.  You have diarrhea.  You have pain when you urinate. Summary  Round ligament pain is felt in the lower abdomen or groin. It is usually a short, sharp, and pinching pain. It can also be a dull, lingering, and aching pain.  This pain usually begins in the second trimester (13-28 weeks). It occurs because the uterus is stretching with the growing baby, and it is not harmful to the baby.  You may notice the pain when you suddenly change position, when you cough or sneeze, or during physical activity.  Relaxing, flexing your knees to your abdomen, lying on one side, or taking a warm bath may help to get rid of the pain.  Get help from your health care provider if the pain does not go away or if you have vaginal bleeding, nausea, vomiting, diarrhea, or painful urination. This information is not intended to replace advice given to you by your health care provider. Make sure you discuss any questions you have with your health care provider. Document Revised: 07/29/2017 Document Reviewed: 07/29/2017 Elsevier Patient Education  2021 Elsevier  Inc.  Group B Streptococcus Infection During Pregnancy Group B Streptococcus (GBS) is a type of bacteria that is often found  in healthy people. It is commonly found in the rectum, vagina, and intestines. In people who are healthy and not pregnant, the bacteria rarely cause serious illness or complications. However, women who test positive for GBS during pregnancy can pass the bacteria to the baby during childbirth. This can cause serious infection in the baby after birth. Women with GBS may also have infections during their pregnancy or soon after childbirth. The infections include urinary tract infections (UTIs) or infections of the uterus. GBS also increases a woman's risk of complications during pregnancy, such as early labor or delivery, miscarriage, or stillbirth. Routine testing for GBS is recommended for all pregnant women. What are the causes? This condition is caused by bacteria called Streptococcus agalactiae. What increases the risk? You may have a higher risk for GBS infection during pregnancy if you had one during a past pregnancy. What are the signs or symptoms? In most cases, GBS infection does not cause symptoms in pregnant women. If symptoms exist, they may include:  Labor that starts before the 37th week of pregnancy.  A UTI or bladder infection. This may cause a fever, frequent urination, or pain and burning during urination.  Fever during labor. There can also be a rapid heartbeat in the mother or baby. Rare but serious symptoms of a GBS infection in women include:  Blood infection (septicemia). This may cause fever, chills, or confusion.  Lung infection (pneumonia). This may cause fever, chills, cough, rapid breathing, chest pain, or difficulty breathing.  Bone, joint, skin, or soft tissue infection. How is this diagnosed? You may be screened for GBS between week 35 and week 37 of pregnancy. If you have symptoms of preterm labor, you may be screened earlier. This  condition is diagnosed based on lab test results from:  A swab of fluid from the vagina and rectum.  A urine sample. How is this treated? This condition is treated with antibiotic medicine. Antibiotic medicine may be given:  To you when you go into labor, or as soon as your water breaks. The medicines will continue until after you give birth. If you are having a cesarean delivery, you do not need antibiotics unless your water has broken.  To your baby, if he or she requires treatment. Your health care provider will check your baby to decide if he or she needs antibiotics to prevent a serious infection.   Follow these instructions at home:  Take over-the-counter and prescription medicines only as told by your health care provider.  Take your antibiotic medicine as told by your health care provider. Do not stop taking the antibiotic even if you start to feel better.  Keep all pre-birth (prenatal) visits and follow-up visits as told by your health care provider. This is important. Contact a health care provider if:  You have pain or burning when you urinate.  You have to urinate more often than usual.  You have a fever or chills.  You develop a bad-smelling vaginal discharge. Get help right away if:  Your water breaks.  You go into labor.  You have severe pain in your abdomen.  You have difficulty breathing.  You have chest pain. These symptoms may represent a serious problem that is an emergency. Do not wait to see if the symptoms will go away. Get medical help right away. Call your local emergency services (911 in the [place] Do not drive yourself to the hospital. Summary  GBS is a type of bacteria that is common in healthy people.  During pregnancy, colonization with GBS can cause serious complications for you or your baby.  Your health care provider will screen you between 35 and 37 weeks of pregnancy to determine if you are colonized with GBS.  If you are colonized  with GBS during pregnancy, your health care provider will recommend antibiotics through an IV during labor.  After delivery, your baby will be evaluated for complications related to potential GBS infection and may require antibiotics to prevent a serious infection. This information is not intended to replace advice given to you by your health care provider. Make sure you discuss any questions you have with your health care provider. Document Revised: 12/13/2019 Document Reviewed: 09/06/2018 Elsevier Patient Education  2021 [employer].

## 2020-03-22 NOTE — Progress Notes (Signed)
   PRENATAL VISIT NOTE  Subjective:  Amanda Burch is a 20 y.o. G1P0 at [redacted]w[redacted]d being seen today for ongoing prenatal care.  She is currently monitored for the following issues for this high-risk pregnancy and has GBS bacteriuria; Chlamydia infection affecting pregnancy in second trimester; Supervision of high risk pregnancy, antepartum; Sickle cell anemia (HCC); Late prenatal care complicating pregnancy; Functional asplenia; and [redacted] weeks gestation of pregnancy on their problem list.  Patient doing well with no acute concerns today. She reports lower pelvic pain.  Contractions: Not present. Vag. Bleeding: None.  Movement: Present.  Denies leaking of fluid.   The following portions of the patient's history were reviewed and updated as appropriate: allergies, current medications, past family history, past medical history, past social history, past surgical history and problem list. Problem list updated.  Objective:   Vitals:   03/22/20 1350  BP: (!) 104/41  Pulse: 98  Weight: 211 lb 3.2 oz (95.8 kg)    Fetal Status: Fetal Heart Rate (bpm): 152 Fundal Height: 35 cm Movement: Present     General:  Alert, oriented and cooperative. Patient is in no acute distress.  Skin: Skin is warm and dry. No rash noted.   Cardiovascular: Normal heart rate noted  Respiratory: Normal respiratory effort, no problems with respiration noted  Abdomen: Soft, gravid, appropriate for gestational age.  Pain/Pressure: Present     Pelvic: Cervical exam deferred        Extremities: Normal range of motion.  Edema: Trace  Mental Status:  Normal mood and affect. Normal behavior. Normal judgment and thought content.   Assessment and Plan:  Pregnancy: G1P0 at [redacted]w[redacted]d  1. GBS bacteriuria Treat in labor  2. Supervision of high risk pregnancy, antepartum   3. Sickle cell disease without crisis (HCC) Continue weekly BPP  4. Late prenatal care affecting pregnancy in third trimester   5. Functional asplenia  6. [redacted]  weeks gestation of pregnancy   Preterm labor symptoms and general obstetric precautions including but not limited to vaginal bleeding, contractions, leaking of fluid and fetal movement were reviewed in detail with the patient.  Please refer to After Visit Summary for other counseling recommendations.   Return in about 1 week (around 03/29/2020) for in person, 36 weeks swabs, HOB.   Mariel Aloe, MD

## 2020-03-23 ENCOUNTER — Ambulatory Visit: Payer: Self-pay | Admitting: Clinical

## 2020-03-23 DIAGNOSIS — Z91199 Patient's noncompliance with other medical treatment and regimen due to unspecified reason: Secondary | ICD-10-CM

## 2020-03-23 DIAGNOSIS — Z5329 Procedure and treatment not carried out because of patient's decision for other reasons: Secondary | ICD-10-CM

## 2020-03-26 ENCOUNTER — Emergency Department (HOSPITAL_COMMUNITY)
Admission: EM | Admit: 2020-03-26 | Discharge: 2020-03-26 | Disposition: A | Discharge status: Home / Self Care | Payer: Medicaid Other | Attending: Emergency Medicine | Admitting: Emergency Medicine

## 2020-03-26 ENCOUNTER — Encounter (HOSPITAL_COMMUNITY): Payer: Self-pay | Admitting: Emergency Medicine

## 2020-03-26 ENCOUNTER — Other Ambulatory Visit: Payer: Self-pay

## 2020-03-26 DIAGNOSIS — D5700 Hb-SS disease with crisis, unspecified: Secondary | ICD-10-CM | POA: Diagnosis not present

## 2020-03-26 DIAGNOSIS — M79602 Pain in left arm: Secondary | ICD-10-CM | POA: Diagnosis not present

## 2020-03-26 DIAGNOSIS — R030 Elevated blood-pressure reading, without diagnosis of hypertension: Secondary | ICD-10-CM | POA: Insufficient documentation

## 2020-03-26 DIAGNOSIS — O26893 Other specified pregnancy related conditions, third trimester: Secondary | ICD-10-CM | POA: Diagnosis present

## 2020-03-26 DIAGNOSIS — Z3A35 35 weeks gestation of pregnancy: Secondary | ICD-10-CM | POA: Diagnosis not present

## 2020-03-26 LAB — CBC WITH DIFFERENTIAL/PLATELET
Abs Immature Granulocytes: 0 10*3/uL (ref 0.00–0.07)
Basophils Absolute: 0 10*3/uL (ref 0.0–0.1)
Basophils Relative: 0 %
Eosinophils Absolute: 0 10*3/uL (ref 0.0–0.5)
Eosinophils Relative: 0 %
HCT: 27.7 % — ABNORMAL LOW (ref 36.0–46.0)
Hemoglobin: 10.4 g/dL — ABNORMAL LOW (ref 12.0–15.0)
Lymphocytes Relative: 17 %
Lymphs Abs: 2 10*3/uL (ref 0.7–4.0)
MCH: 33.3 pg (ref 26.0–34.0)
MCHC: 37.5 g/dL — ABNORMAL HIGH (ref 30.0–36.0)
MCV: 88.8 fL (ref 80.0–100.0)
Monocytes Absolute: 0.7 10*3/uL (ref 0.1–1.0)
Monocytes Relative: 6 %
Neutro Abs: 9 10*3/uL — ABNORMAL HIGH (ref 1.7–7.7)
Neutrophils Relative %: 77 %
Platelets: 299 10*3/uL (ref 150–400)
RBC: 3.12 MIL/uL — ABNORMAL LOW (ref 3.87–5.11)
RDW: 14.1 % (ref 11.5–15.5)
WBC: 11.7 10*3/uL — ABNORMAL HIGH (ref 4.0–10.5)
nRBC: 4.8 % — ABNORMAL HIGH (ref 0.0–0.2)
nRBC: 7 /100 WBC — ABNORMAL HIGH

## 2020-03-26 LAB — URINALYSIS, ROUTINE W REFLEX MICROSCOPIC
Bilirubin Urine: NEGATIVE
Glucose, UA: NEGATIVE mg/dL
Hgb urine dipstick: NEGATIVE
Ketones, ur: NEGATIVE mg/dL
Leukocytes,Ua: NEGATIVE
Nitrite: NEGATIVE
Protein, ur: NEGATIVE mg/dL
Specific Gravity, Urine: 1.008 (ref 1.005–1.030)
pH: 6 (ref 5.0–8.0)

## 2020-03-26 LAB — RETICULOCYTES
Immature Retic Fract: 36.8 % — ABNORMAL HIGH (ref 2.3–15.9)
RBC.: 3.22 MIL/uL — ABNORMAL LOW (ref 3.87–5.11)
Retic Count, Absolute: 240.9 10*3/uL — ABNORMAL HIGH (ref 19.0–186.0)
Retic Ct Pct: 7.5 % — ABNORMAL HIGH (ref 0.4–3.1)

## 2020-03-26 LAB — COMPREHENSIVE METABOLIC PANEL
ALT: 20 U/L (ref 0–44)
AST: 37 U/L (ref 15–41)
Albumin: 2.8 g/dL — ABNORMAL LOW (ref 3.5–5.0)
Alkaline Phosphatase: 127 U/L — ABNORMAL HIGH (ref 38–126)
Anion gap: 11 (ref 5–15)
BUN: 5 mg/dL — ABNORMAL LOW (ref 6–20)
CO2: 19 mmol/L — ABNORMAL LOW (ref 22–32)
Calcium: 8.2 mg/dL — ABNORMAL LOW (ref 8.9–10.3)
Chloride: 106 mmol/L (ref 98–111)
Creatinine, Ser: 0.63 mg/dL (ref 0.44–1.00)
GFR, Estimated: >60 mL/min (ref >60)
Glucose, Bld: 113 mg/dL — ABNORMAL HIGH (ref 70–99)
Potassium: 3.8 mmol/L (ref 3.5–5.1)
Sodium: 136 mmol/L (ref 135–145)
Total Bilirubin: 0.8 mg/dL (ref 0.3–1.2)
Total Protein: 6.7 g/dL (ref 6.5–8.1)

## 2020-03-26 MED ORDER — ACETAMINOPHEN 325 MG PO TABS
650.0000 mg | ORAL_TABLET | Freq: Once | ORAL | Status: AC
  Administered 2020-03-26: 650 mg via ORAL
  Filled 2020-03-26: qty 2

## 2020-03-26 MED ORDER — MORPHINE SULFATE (PF) 4 MG/ML IV SOLN
4.0000 mg | Freq: Once | INTRAVENOUS | Status: AC
  Administered 2020-03-26: 4 mg via INTRAVENOUS
  Filled 2020-03-26: qty 1

## 2020-03-26 MED ORDER — LACTATED RINGERS IV BOLUS
1000.0000 mL | Freq: Once | INTRAVENOUS | Status: AC
  Administered 2020-03-26: 1000 mL via INTRAVENOUS

## 2020-03-26 MED ORDER — MORPHINE SULFATE (PF) 4 MG/ML IV SOLN
6.0000 mg | Freq: Once | INTRAVENOUS | Status: AC
  Administered 2020-03-26: 6 mg via INTRAVENOUS
  Filled 2020-03-26: qty 2

## 2020-03-26 NOTE — Progress Notes (Signed)
ED RN called to notify [facility] about 35 week pt in ED complaining of left arm pain and numbness with history of sickle cell.

## 2020-03-26 NOTE — Progress Notes (Signed)
20 minute NST done and Dr. Vergie Living called and updated about pt. Pt cleared by Dr. Vergie Living from [facility] standpoint. Pt d/c from monitors.

## 2020-03-26 NOTE — ED Provider Notes (Signed)
[facility] EMERGENCY DEPARTMENT Provider Note   CSN: [id number] Arrival date & time: 03/26/20  1657     History Chief Complaint  Patient presents with  . Sickle Cell Pain Crisis    Amanda Burch is a 20 y.o. female.   Sickle Cell Pain Crisis Location:  Upper extremity and L side Severity:  Severe Onset quality:  Gradual Duration:  3 days Similar to previous crisis episodes: yes   Timing:  Constant Progression:  Waxing and waning Chronicity:  New Sickle cell genotype:  Unable to specify History of pulmonary emboli: no   Context: pregnancy   Relieved by:  Nothing Worsened by:  Nothing Ineffective treatments:  None tried Associated symptoms: no chest pain, no cough, no fever, no shortness of breath, no sore throat and no vomiting        Past Medical History:  Diagnosis Date  . Sickle cell anemia (HCC)   . Sickle cell disease with crisis (HCC) 03/31/2014  . Vision abnormalities     Patient Active Problem List   Diagnosis Date Noted  . [redacted] weeks gestation of pregnancy 03/22/2020  . Supervision of high risk pregnancy, antepartum 02/08/2020  . Sickle cell anemia (HCC)   . Late prenatal care complicating pregnancy   . Chlamydia infection affecting pregnancy in second trimester 02/02/2020  . GBS bacteriuria 01/30/2020  . 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 NO PAST SURGERIES       OB History    Gravida  1   Para      Term      Preterm      AB      Living        SAB      IAB      Ectopic      Multiple      Live Births              Family History  Adopted: Yes  Problem Relation Age of Onset  . Asthma Mother   . Sarcoidosis Mother   . Deep vein thrombosis Mother     Social History   Tobacco Use  . Smoking status: Never Smoker  . Smokeless tobacco: Never Used  Vaping Use  . Vaping Use: Never used  Substance Use Topics  . Alcohol use: No  . Drug use: No     Home Medications Prior to Admission medications   Medication Sig Start Date End Date Taking? Authorizing Provider  acetaminophen (TYLENOL) 500 MG tablet Take 1 tablet (500 mg total) by mouth every 6 (six) hours as needed. 01/27/20   Gerrit Heck, CNM  polyethylene glycol (MIRALAX / GLYCOLAX) packet Take 136 g by mouth once. 136g (8 packet) in 32oz of fluid. Patient not taking: No sig reported 04/02/14   Joanna Puff, MD  Prenatal Vit-Fe Fumarate-FA (PREPLUS) 27-1 MG TABS Take 1 tablet by mouth daily. 03/22/20   Warden Fillers, MD    Allergies    Patient has no known allergies.  Review of Systems   Review of Systems  Constitutional: Negative for chills and fever.  HENT: Negative for ear pain and sore throat.   Eyes: Negative for pain and visual disturbance.  Respiratory: Negative for cough and shortness of breath.   Cardiovascular: Negative for chest pain and palpitations.  Gastrointestinal: Negative for abdominal pain and vomiting.  Genitourinary: Negative for dysuria and hematuria.  Musculoskeletal: Negative for arthralgias and back pain.  LUE pain  Skin: Negative for color change and rash.  Neurological: Negative for seizures and syncope.  All other systems reviewed and are negative.   Physical Exam Updated Vital Signs BP 131/84   Pulse 80   Temp 98.5 F (36.9 C) (Oral)   Resp 15   LMP 09/02/2019   SpO2 98%   Physical Exam Vitals and nursing note reviewed.  Constitutional:      General: She is not in acute distress.    Appearance: She is well-developed and well-nourished.  HENT:     Head: Normocephalic and atraumatic.  Eyes:     Conjunctiva/sclera: Conjunctivae normal.  Cardiovascular:     Rate and Rhythm: Normal rate and regular rhythm.     Heart sounds: No murmur heard.   Pulmonary:     Effort: Pulmonary effort is normal. No respiratory distress.     Breath sounds: Normal breath sounds.  Abdominal:     Palpations: Abdomen is soft.      Tenderness: There is no abdominal tenderness.     Comments: Gravid abd  Musculoskeletal:        General: No edema.     Left shoulder: Tenderness present. No deformity. Normal strength. Normal pulse.     Left upper arm: Tenderness present.     Left elbow: Tenderness present.     Left forearm: Tenderness present. No swelling or edema.     Left wrist: Tenderness present. Normal pulse.     Left hand: Tenderness present. Normal strength. Normal sensation. There is no disruption of two-point discrimination. Normal capillary refill.     Cervical back: Neck supple.  Skin:    General: Skin is warm and dry.  Neurological:     Mental Status: She is alert.  Psychiatric:        Mood and Affect: Mood and affect normal.     ED Results / Procedures / Treatments   Labs (all labs ordered are listed, but only abnormal results are displayed) Labs Reviewed  COMPREHENSIVE METABOLIC PANEL - Abnormal; Notable for the following components:      Result Value   CO2 19 (*)    Glucose, Bld 113 (*)    BUN 5 (*)    Calcium 8.2 (*)    Albumin 2.8 (*)    Alkaline Phosphatase 127 (*)    All other components within normal limits  CBC WITH DIFFERENTIAL/PLATELET - Abnormal; Notable for the following components:   WBC 11.7 (*)    RBC 3.12 (*)    Hemoglobin 10.4 (*)    HCT 27.7 (*)    MCHC 37.5 (*)    nRBC 4.8 (*)    Neutro Abs 9.0 (*)    nRBC 7 (*)    All other components within normal limits  RETICULOCYTES - Abnormal; Notable for the following components:   Retic Ct Pct 7.5 (*)    RBC. 3.22 (*)    Retic Count, Absolute 240.9 (*)    Immature Retic Fract 36.8 (*)    All other components within normal limits  URINALYSIS, ROUTINE W REFLEX MICROSCOPIC    EKG None  Radiology No results found.  Procedures Procedures   Medications Ordered in ED Medications  lactated ringers bolus 1,000 mL (1,000 mLs Intravenous New Bag/Given 03/26/20 1937)  morphine 4 MG/ML injection 6 mg (6 mg Intravenous Given  03/26/20 1937)  acetaminophen (TYLENOL) tablet 650 mg (650 mg Oral Given 03/26/20 1936)  morphine 4 MG/ML injection 4 mg (4 mg Intravenous Given 03/26/20 2106)  ED Course  I have reviewed the triage vital signs and the nursing notes.  Pertinent labs & imaging results that were available during my care of the patient were reviewed by me and considered in my medical decision making (see chart for details).    MDM Rules/Calculators/A&P                          This is a 20 year old female G1, P0 at 35 weeks and 5 days and reported history of sickle cell disease who presents emergency department complaining of left upper extremity pain consistent with her sickle cell pain crises in the past.  She denies any chest pain, headaches, vision changes, seizure activity, vaginal bleeding, vaginal discharge, contraction pain, loss of fluid.  She was previously seen at an outside hospital emergency department for sickle cell pain crisis and was given IV analgesia with only mild improvement of symptoms.  On arrival she is afebrile, hemodynamically stable, no acute distress.  The entire left upper extremity is tender to both light and deep touch.  She ranges the joints without significant pain, the left upper extremity is warm, but is not erythematous or hot to concern me for infected shoulder, elbow, or wrist.  She has normal pulses, normal sensation normal distal motor intact.  We discussed opioid medication in the setting of pregnancy and that fetus is at risk for exposure and transmission across the placenta, patient understands this risk and is agreeable to IV analgesia for her pain in the form of opiates. No chest pain or hypoxia, doubt acute chest.  She was given 1 L lactated Ringer's, IV morphine, and on reevaluation she reports her pain has nearly resolved.  Her serologies show that her cell lines are at baseline with no significant decrease in cell lines to concern me for aplastic crisis.  Reticulocyte  count is elevated consistent with sickle cell pain crisis.  Obstetrics evaluated the patient at bedside and monitored patient on toco with 20-minute ST performed and cleared.  Good fetal movement.  Of note the patient did have an elevated blood pressure greater than 140 systolic in the emergency department, she denies any symptoms of preeclampsia, urinalysis shows no protein nor asymptomatic bacteruria.  Patient will require reevaluation and serial blood pressure checks to monitor her blood pressure and potential symptoms of preeclampsia they are not present at this time. Given her pain has improved and she describes pain c/w sickle cell, doubt DVT (no significant swelling), but would recommend she see PCP who can order u/s if pain returns or swelling presents.  Given that her pain has resolved with IV pain medications and fluids, recommend patient return home and continue take Tylenol as needed for pain, follow-up with both OB/GYN as well as primary care doctor/hematologist.  Final Clinical Impression(s) / ED Diagnoses Final diagnoses:  Sickle cell pain crisis [facility])    Rx / DC Orders ED Discharge Orders    None       Kathleen Lime, MD 03/26/20 2302    Marily Memos, MD 03/27/20 1350

## 2020-03-26 NOTE — Progress Notes (Signed)
OBR[facility] arrived to room and pt placed on external monitors. Pt is a G1P0 at 35 and 5. Pregnancy complicated by sickle cell and pt late to prenatal care. Pt denies any contractions and no lose of fluid. Pt reports positive fetal movement.

## 2020-03-26 NOTE — ED Triage Notes (Signed)
Pt c/o left arm pain and numbness that started yesterday. Pt seen at a hospital in [place] yesterday for same. Hx sickle cell, also reports she is 53mos pregnant.

## 2020-03-26 NOTE — Discharge Instructions (Signed)
Please follow-up with your primary care doctor/hematologist, as well as your OB/GYN.  Please use Tylenol over the next few days to help with the pain, drink plenty of fluids, I recommend that you drink water and not sugary drinks and soda as this can make dehydration worse and make pain worse.  Return the emergency department if pain is not controlled with Tylenol, you develop any vaginal bleeding, contractions, leakage of fluid, headaches, vision changes, seizure activity.

## 2020-03-27 ENCOUNTER — Ambulatory Visit (HOSPITAL_COMMUNITY)
Admission: RE | Admit: 2020-03-27 | Discharge: 2020-03-27 | Disposition: A | Discharge status: Home / Self Care | Payer: Medicaid Other | Source: Ambulatory Visit | Attending: Emergency Medicine | Admitting: Emergency Medicine

## 2020-03-27 DIAGNOSIS — R609 Edema, unspecified: Secondary | ICD-10-CM | POA: Insufficient documentation

## 2020-03-27 DIAGNOSIS — M7989 Other specified soft tissue disorders: Secondary | ICD-10-CM

## 2020-03-27 DIAGNOSIS — R52 Pain, unspecified: Secondary | ICD-10-CM | POA: Diagnosis present

## 2020-03-27 DIAGNOSIS — M79602 Pain in left arm: Secondary | ICD-10-CM

## 2020-03-27 DIAGNOSIS — M79609 Pain in unspecified limb: Secondary | ICD-10-CM

## 2020-03-27 NOTE — Progress Notes (Signed)
Upper extremity venous has been completed.   Preliminary results in CV Proc.   Blanch Media 03/27/2020 11:21 AM

## 2020-03-30 ENCOUNTER — Other Ambulatory Visit (HOSPITAL_COMMUNITY)
Admission: RE | Admit: 2020-03-30 | Discharge: 2020-03-30 | Disposition: A | Discharge status: Home / Self Care | Payer: Medicaid Other | Source: Ambulatory Visit | Attending: Obstetrics & Gynecology | Admitting: Obstetrics & Gynecology

## 2020-03-30 ENCOUNTER — Other Ambulatory Visit: Payer: Self-pay

## 2020-03-30 ENCOUNTER — Ambulatory Visit (INDEPENDENT_AMBULATORY_CARE_PROVIDER_SITE_OTHER): Payer: Medicaid Other | Admitting: Medical

## 2020-03-30 ENCOUNTER — Encounter: Payer: Self-pay | Admitting: Medical

## 2020-03-30 VITALS — BP 122/87 | HR 89 | Wt 212.1 lb

## 2020-03-30 DIAGNOSIS — O0990 Supervision of high risk pregnancy, unspecified, unspecified trimester: Secondary | ICD-10-CM

## 2020-03-30 DIAGNOSIS — R8271 Bacteriuria: Secondary | ICD-10-CM

## 2020-03-30 DIAGNOSIS — O0933 Supervision of pregnancy with insufficient antenatal care, third trimester: Secondary | ICD-10-CM

## 2020-03-30 DIAGNOSIS — Q8901 Asplenia (congenital): Secondary | ICD-10-CM

## 2020-03-30 DIAGNOSIS — Z3A36 36 weeks gestation of pregnancy: Secondary | ICD-10-CM

## 2020-03-30 DIAGNOSIS — D571 Sickle-cell disease without crisis: Secondary | ICD-10-CM

## 2020-03-30 LAB — OB RESULTS CONSOLE GC/CHLAMYDIA: Gonorrhea: NEGATIVE

## 2020-03-30 NOTE — Progress Notes (Signed)
   PRENATAL VISIT NOTE  Subjective:  Amanda Burch is a 20 y.o. G1P0 at [redacted]w[redacted]d being seen today for ongoing prenatal care.  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  Patient reports seen in ED in [place] for SS crisis earlier this week, still having some pain, but much improved. .  Contractions: Irritability. Vag. Bleeding: None.  Movement: Present. Denies leaking of fluid.   The following portions of the patient's history were reviewed and updated as appropriate: allergies, current medications, past family history, past medical history, past social history, past surgical history and problem list.   Objective:   Vitals:   03/30/20 1110  BP: 122/87  Pulse: 89  Weight: 212 lb 1.6 oz (96.2 kg)    Fetal Status: Fetal Heart Rate (bpm): 152   Movement: Present     General:  Alert, oriented and cooperative. Patient is in no acute distress.  Skin: Skin is warm and dry. No rash noted.   Cardiovascular: Normal heart rate noted  Respiratory: Normal respiratory effort, no problems with respiration noted  Abdomen: Soft, gravid, appropriate for gestational age.  Pain/Pressure: Absent     Pelvic: Cervical exam performed in the presence of a chaperone       closed, 20%, -3, vertex  Extremities: Normal range of motion.  Edema: Trace  Mental Status: Normal mood and affect. Normal behavior. Normal judgment and thought content.   Assessment and Plan:  Pregnancy: G1P0 at [redacted]w[redacted]d 1. Supervision of high risk pregnancy, antepartum - CHL AMB BABYSCRIPTS SCHEDULE OPTIMIZATION - GC/Chlamydia probe amp ([place])not at [facility] - Has Peds list, discussed importance of choosing Peds prior to delivery   2. Sickle cell  disease without crisis (HCC) - Per MFM needs weekly BPP until delivery, has not been getting these, will resume next week  3. Late prenatal care affecting pregnancy in third trimester  4. GBS bacteriuria - Treat in labor  5. Functional asplenia  6. [redacted] weeks gestation of pregnancy  Preterm labor symptoms and general obstetric precautions including but not limited to vaginal bleeding, contractions, leaking of fluid and fetal movement were reviewed in detail with the patient. Please refer to After Visit Summary for other counseling recommendations.   Return in about 1 week (around 04/06/2020) for [facility] MD only, In-Person, NST/BPP.  No future appointments.  Vonzella Nipple, PA-C

## 2020-03-30 NOTE — Patient Instructions (Signed)
Fetal Movement Counts Patient Name: ________________________________________________ Patient Due Date: ____________________  What is a fetal movement count? A fetal movement count is the number of times that you feel your baby move during a certain amount of time. This may also be called a fetal kick count. A fetal movement count is recommended for every pregnant woman. You may be asked to start counting fetal movements as early as week 28 of your pregnancy. Pay attention to when your baby is most active. You may notice your baby's sleep and wake cycles. You may also notice things that make your baby move more. You should do a fetal movement count:  When your baby is normally most active.  At the same time each day. A good time to count movements is while you are resting, after having something to eat and drink. How do I count fetal movements? 1. Find a quiet, comfortable area. Sit, or lie down on your side. 2. Write down the date, the start time and stop time, and the number of movements that you felt between those two times. Take this information with you to your health care visits. 3. Write down your start time when you feel the first movement. 4. Count kicks, flutters, swishes, rolls, and jabs. You should feel at least 10 movements. 5. You may stop counting after you have felt 10 movements, or if you have been counting for 2 hours. Write down the stop time. 6. If you do not feel 10 movements in 2 hours, contact your health care provider for further instructions. Your health care provider may want to do additional tests to assess your baby's well-being. Contact a health care provider if:  You feel fewer than 10 movements in 2 hours.  Your baby is not moving like he or she usually does. Date: ____________ Start time: ____________ Stop time: ____________ Movements: ____________ Date: ____________ Start time: ____________ Stop time: ____________ Movements: ____________ Date: ____________  Start time: ____________ Stop time: ____________ Movements: ____________ Date: ____________ Start time: ____________ Stop time: ____________ Movements: ____________ Date: ____________ Start time: ____________ Stop time: ____________ Movements: ____________ Date: ____________ Start time: ____________ Stop time: ____________ Movements: ____________ Date: ____________ Start time: ____________ Stop time: ____________ Movements: ____________ Date: ____________ Start time: ____________ Stop time: ____________ Movements: ____________ Date: ____________ Start time: ____________ Stop time: ____________ Movements: ____________ This information is not intended to replace advice given to you by your health care provider. Make sure you discuss any questions you have with your health care provider. Document Revised: 09/30/2018 Document Reviewed: 09/30/2018 Elsevier Patient Education  2021 Elsevier Inc. Rosen's Emergency Medicine: Concepts and Clinical Practice (9th ed., pp. 2296- 2312). Elsevier.">    Braxton Hicks Contractions Contractions of the uterus can occur throughout pregnancy, but they are not always a sign that you are in labor. You may have practice contractions called Braxton Hicks contractions. These false labor contractions are sometimes confused with true labor. What are Braxton Hicks contractions? Braxton Hicks contractions are tightening movements that occur in the muscles of the uterus before labor. Unlike true labor contractions, these contractions do not result in opening (dilation) and thinning of the cervix. Toward the end of pregnancy (32-34 weeks), Braxton Hicks contractions can happen more often and may become stronger. These contractions are sometimes difficult to tell apart from true labor because they can be very uncomfortable. You should not feel embarrassed if you go to the hospital with false labor. Sometimes, the only way to tell if you are in true labor is   for your health care  provider to look for changes in the cervix. The health care provider will do a physical exam and may monitor your contractions. If you are not in true labor, the exam should show that your cervix is not dilating and your water has not broken. If there are no other health problems associated with your pregnancy, it is completely safe for you to be sent home with false labor. You may continue to have Braxton Hicks contractions until you go into true labor. How to tell the difference between true labor and false labor True labor  Contractions last 30-70 seconds.  Contractions become very regular.  Discomfort is usually felt in the top of the uterus, and it spreads to the lower abdomen and low back.  Contractions do not go away with walking.  Contractions usually become more intense and increase in frequency.  The cervix dilates and gets thinner. False labor  Contractions are usually shorter and not as strong as true labor contractions.  Contractions are usually irregular.  Contractions are often felt in the front of the lower abdomen and in the groin.  Contractions may go away when you walk around or change positions while lying down.  Contractions get weaker and are shorter-lasting as time goes on.  The cervix usually does not dilate or become thin. Follow these instructions at home:  Take over-the-counter and prescription medicines only as told by your health care provider.  Keep up with your usual exercises and follow other instructions from your health care provider.  Eat and drink lightly if you think you are going into labor.  If Braxton Hicks contractions are making you uncomfortable: ? Change your position from lying down or resting to walking, or change from walking to resting. ? Sit and rest in a tub of warm water. ? Drink enough fluid to keep your urine pale yellow. Dehydration may cause these contractions. ? Do slow and deep breathing several times an hour.  Keep  all follow-up prenatal visits as told by your health care provider. This is important.   Contact a health care provider if:  You have a fever.  You have continuous pain in your abdomen. Get help right away if:  Your contractions become stronger, more regular, and closer together.  You have fluid leaking or gushing from your vagina.  You pass blood-tinged mucus (bloody show).  You have bleeding from your vagina.  You have low back pain that you never had before.  You feel your baby's head pushing down and causing pelvic pressure.  Your baby is not moving inside you as much as it used to. Summary  Contractions that occur before labor are called Braxton Hicks contractions, false labor, or practice contractions.  Braxton Hicks contractions are usually shorter, weaker, farther apart, and less regular than true labor contractions. True labor contractions usually become progressively stronger and regular, and they become more frequent.  Manage discomfort from Braxton Hicks contractions by changing position, resting in a warm bath, drinking plenty of water, or practicing deep breathing. This information is not intended to replace advice given to you by your health care provider. Make sure you discuss any questions you have with your health care provider. Document Revised: 01/23/2017 Document Reviewed: 06/26/2016 Elsevier Patient Education  2021 Elsevier Inc.  

## 2020-04-01 LAB — GC/CHLAMYDIA PROBE AMP (~~LOC~~) NOT AT ARMC
Chlamydia: NEGATIVE
Comment: NEGATIVE
Comment: NORMAL
Neisseria Gonorrhea: NEGATIVE

## 2020-04-09 ENCOUNTER — Ambulatory Visit (INDEPENDENT_AMBULATORY_CARE_PROVIDER_SITE_OTHER): Payer: Medicaid Other | Admitting: Obstetrics & Gynecology

## 2020-04-09 ENCOUNTER — Encounter: Payer: Self-pay | Admitting: *Deleted

## 2020-04-09 ENCOUNTER — Other Ambulatory Visit: Payer: Self-pay | Admitting: Obstetrics & Gynecology

## 2020-04-09 ENCOUNTER — Other Ambulatory Visit: Payer: Self-pay

## 2020-04-09 ENCOUNTER — Ambulatory Visit (INDEPENDENT_AMBULATORY_CARE_PROVIDER_SITE_OTHER): Payer: Medicaid Other | Admitting: *Deleted

## 2020-04-09 ENCOUNTER — Ambulatory Visit: Payer: Self-pay

## 2020-04-09 VITALS — BP 127/77 | HR 90 | Wt 211.8 lb

## 2020-04-09 DIAGNOSIS — D571 Sickle-cell disease without crisis: Secondary | ICD-10-CM | POA: Diagnosis not present

## 2020-04-09 DIAGNOSIS — O0990 Supervision of high risk pregnancy, unspecified, unspecified trimester: Secondary | ICD-10-CM

## 2020-04-09 NOTE — Progress Notes (Signed)

## 2020-04-09 NOTE — Progress Notes (Signed)
   PRENATAL VISIT NOTE  Subjective:  Amanda Burch is a 20 y.o. G1P0 at [redacted]w[redacted]d being seen today for ongoing prenatal care.  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  Patient reports sickle cell pain not taking any pain medication.  Contractions: Irregular. Vag. Bleeding: None.  Movement: Present. Denies leaking of fluid.   The following portions of the patient's history were reviewed and updated as appropriate: allergies, current medications, past family history, past medical history, past social history, past surgical history and problem list.   Objective:   Vitals:   04/09/20 1320  BP: 127/77  Pulse: 90  Weight: 211 lb 12.8 oz (96.1 kg)    Fetal Status: Fetal Heart Rate (bpm): NST   Movement: Present     General:  Alert, oriented and cooperative. Patient is in no acute distress.  Skin: Skin is warm and dry. No rash noted.   Cardiovascular: Normal heart rate noted  Respiratory: Normal respiratory effort, no problems with respiration noted  Abdomen: Soft, gravid, appropriate for gestational age.  Pain/Pressure: Absent     Pelvic: Cervical exam deferred        Extremities: Normal range of motion.  Edema: Trace  Mental Status: Normal mood and affect. Normal behavior. Normal judgment and thought content.   Assessment and Plan:  Pregnancy: G1P0 at [redacted]w[redacted]d 1. Supervision of high risk pregnancy, antepartum Reviewed growth [place] 1/13 19%ile, ordered f/u  2. Sickle cell disease without crisis (HCC) Delivery by 39 weeks - [place] MFM OB FOLLOW UP; Future  Term labor symptoms and general obstetric precautions including but not limited to vaginal bleeding, contractions, leaking of fluid and fetal movement were  reviewed in detail with the patient. Please refer to After Visit Summary for other counseling recommendations.   Return in about 1 week (around 04/16/2020) for [facility] and NST/BPP.  Future Appointments  Date Time Provider Department Center  04/09/2020  2:15 PM Adam Phenix, MD [facility]  04/09/2020  2:35 PM [facility] US1 [facility]  04/16/2020  1:15 PM [facility] NST [facility]  04/16/2020  2:15 PM Adam Phenix, MD [facility]  04/23/2020  1:15 PM [facility] NST [facility]  04/23/2020  2:15 PM Adam Phenix, MD [facility]  04/30/2020  1:15 PM [facility] NST [facility]  04/30/2020  2:15 PM Constant, Gigi Gin, MD [facility]    Scheryl Darter, MD

## 2020-04-09 NOTE — Patient Instructions (Signed)
Labor Induction Labor induction is when steps are taken to cause a pregnant woman to begin the labor process. Most women go into labor on their own between 37 weeks and 42 weeks of pregnancy. When this does not happen, or when there is a medical need for labor to begin, steps may be taken to induce, or bring on, labor. Labor induction causes a pregnant woman's uterus to contract. It also causes the cervix to soften (ripen), open (dilate), and thin out. Usually, labor is not induced before 39 weeks of pregnancy unless there is a medical reason to do so. When is labor induction considered? Labor induction may be right for you if:  Your pregnancy lasts longer than 41 to 42 weeks.  Your placenta is separating from your uterus (placental abruption).  You have a rupture of membranes and your labor does not begin.  You have health problems, like diabetes or high blood pressure (preeclampsia) during your pregnancy.  Your baby has stopped growing or does not have enough amniotic fluid. Before labor induction begins, your health care provider will consider the following factors:  Your medical condition and the baby's condition.  How many weeks you have been pregnant.  How mature the baby's lungs are.  The condition of your cervix.  The position of the baby.  The size of your birth canal. Tell a health care provider about:  Any allergies you have.  All medicines you are taking, including vitamins, herbs, eye drops, creams, and over-the-counter medicines.  Any problems you or your family members have had with anesthetic medicines.  Any surgeries you have had.  Any blood disorders you have.  Any medical conditions you have. What are the risks? Generally, this is a safe procedure. However, problems may occur, including:  Failed induction.  Changes in fetal heart rate, such as being too high, too low, or irregular (erratic).  Infection in the mother or the baby.  Increased risk of  having a cesarean delivery.  Breaking off (abruption) of the placenta from the uterus. This is rare.  Rupture of the uterus. This is very rare.  Your baby could fail to get enough blood flow or oxygen. This can be life-threatening. When induction is needed for medical reasons, the benefits generally outweigh the risks. What happens during the procedure? During the procedure, your health care provider will use one of these methods to induce labor:  Stripping the membranes. In this method, the amniotic sac tissue is gently separated from the cervix. This causes the following to happen: ? Your cervix stretches, which in turn causes the release of prostaglandins. ? Prostaglandins induce labor and cause the uterus to contract. ? This procedure is often done in an office visit. You will be sent home to wait for contractions to begin.  Prostaglandin medicine. This medicine starts contractions and causes the cervix to dilate and ripen. This can be taken by mouth (orally) or by being inserted into the vagina (suppository).  Inserting a small, thin tube (catheter) with a balloon into the vagina and then expanding the balloon with water to dilate the cervix.  Breaking the water. In this method, a small instrument is used to make a small hole in the amniotic sac. This eventually causes the amniotic sac to break. Contractions should begin within a few hours.  Medicine to trigger or strengthen contractions. This medicine is given through an IV that is inserted into a vein in your arm. This procedure may vary among health care providers and hospitals.     Where to find more information  [employer]: www.marchofdimes.org  The [employer]: www.acog.org Summary  Labor induction causes a pregnant woman's uterus to contract. It also causes the cervix to soften (ripen), open (dilate), and thin out.  Labor is usually not induced before 39 weeks of pregnancy unless  there is a medical reason to do so.  When induction is needed for medical reasons, the benefits generally outweigh the risks.  Talk with your health care provider about which methods of labor induction are right for you. This information is not intended to replace advice given to you by your health care provider. Make sure you discuss any questions you have with your health care provider. Document Revised: 11/24/2019 Document Reviewed: 11/24/2019 [employer] Patient Education  2021 [employer]  

## 2020-04-11 ENCOUNTER — Telehealth (HOSPITAL_COMMUNITY): Payer: Self-pay | Admitting: *Deleted

## 2020-04-11 ENCOUNTER — Ambulatory Visit: Payer: Medicaid Other

## 2020-04-11 ENCOUNTER — Encounter (HOSPITAL_COMMUNITY): Payer: Self-pay | Admitting: *Deleted

## 2020-04-11 ENCOUNTER — Other Ambulatory Visit: Payer: Self-pay | Admitting: Advanced Practice Midwife

## 2020-04-11 NOTE — Telephone Encounter (Signed)
Preadmission screen  

## 2020-04-12 ENCOUNTER — Other Ambulatory Visit: Payer: Self-pay | Admitting: General Practice

## 2020-04-12 ENCOUNTER — Encounter: Payer: Self-pay | Admitting: General Practice

## 2020-04-12 DIAGNOSIS — D571 Sickle-cell disease without crisis: Secondary | ICD-10-CM

## 2020-04-16 ENCOUNTER — Encounter: Payer: Self-pay | Admitting: *Deleted

## 2020-04-16 ENCOUNTER — Encounter: Payer: Self-pay | Admitting: Obstetrics & Gynecology

## 2020-04-16 ENCOUNTER — Other Ambulatory Visit: Payer: Self-pay

## 2020-04-16 ENCOUNTER — Ambulatory Visit: Payer: Medicaid Other | Admitting: *Deleted

## 2020-04-16 ENCOUNTER — Ambulatory Visit: Payer: Medicaid Other | Attending: Obstetrics & Gynecology

## 2020-04-16 DIAGNOSIS — D571 Sickle-cell disease without crisis: Secondary | ICD-10-CM | POA: Insufficient documentation

## 2020-04-16 DIAGNOSIS — O0990 Supervision of high risk pregnancy, unspecified, unspecified trimester: Secondary | ICD-10-CM

## 2020-04-16 DIAGNOSIS — O0933 Supervision of pregnancy with insufficient antenatal care, third trimester: Secondary | ICD-10-CM | POA: Diagnosis not present

## 2020-04-16 DIAGNOSIS — O99013 Anemia complicating pregnancy, third trimester: Secondary | ICD-10-CM | POA: Diagnosis not present

## 2020-04-16 DIAGNOSIS — Q8901 Asplenia (congenital): Secondary | ICD-10-CM

## 2020-04-16 DIAGNOSIS — O99891 Other specified diseases and conditions complicating pregnancy: Secondary | ICD-10-CM

## 2020-04-16 DIAGNOSIS — Z3A38 38 weeks gestation of pregnancy: Secondary | ICD-10-CM

## 2020-04-17 ENCOUNTER — Other Ambulatory Visit (HOSPITAL_COMMUNITY)
Admission: RE | Admit: 2020-04-17 | Discharge: 2020-04-17 | Disposition: A | Discharge status: Home / Self Care | Payer: Medicaid Other | Source: Ambulatory Visit | Attending: Family Medicine | Admitting: Family Medicine

## 2020-04-17 DIAGNOSIS — Z20822 Contact with and (suspected) exposure to covid-19: Secondary | ICD-10-CM | POA: Diagnosis not present

## 2020-04-17 DIAGNOSIS — Z01812 Encounter for preprocedural laboratory examination: Secondary | ICD-10-CM | POA: Diagnosis present

## 2020-04-17 LAB — SARS CORONAVIRUS 2 (TAT 6-24 HRS): SARS Coronavirus 2: NEGATIVE

## 2020-04-18 ENCOUNTER — Ambulatory Visit (INDEPENDENT_AMBULATORY_CARE_PROVIDER_SITE_OTHER): Payer: Medicaid Other | Admitting: *Deleted

## 2020-04-18 ENCOUNTER — Other Ambulatory Visit: Payer: Self-pay

## 2020-04-18 ENCOUNTER — Encounter: Payer: Self-pay | Admitting: Family Medicine

## 2020-04-18 ENCOUNTER — Ambulatory Visit (INDEPENDENT_AMBULATORY_CARE_PROVIDER_SITE_OTHER): Payer: Medicaid Other | Admitting: Family Medicine

## 2020-04-18 VITALS — BP 124/73 | HR 88 | Wt 207.9 lb

## 2020-04-18 DIAGNOSIS — O0990 Supervision of high risk pregnancy, unspecified, unspecified trimester: Secondary | ICD-10-CM

## 2020-04-18 DIAGNOSIS — D571 Sickle-cell disease without crisis: Secondary | ICD-10-CM | POA: Diagnosis not present

## 2020-04-18 DIAGNOSIS — R8271 Bacteriuria: Secondary | ICD-10-CM

## 2020-04-18 NOTE — Patient Instructions (Signed)
 Contraception Choices Contraception, also called birth control, refers to methods or devices that prevent pregnancy. Hormonal methods Contraceptive implant A contraceptive implant is a thin, plastic tube that contains a hormone that prevents pregnancy. It is different from an intrauterine device (IUD). It is inserted into the upper part of the arm by a health care provider. Implants can be effective for up to 3 years. Progestin-only injections Progestin-only injections are injections of progestin, a synthetic form of the hormone progesterone. They are given every 3 months by a health care provider. Birth control pills Birth control pills are pills that contain hormones that prevent pregnancy. They must be taken once a day, preferably at the same time each day. A prescription is needed to use this method of contraception. Birth control patch The birth control patch contains hormones that prevent pregnancy. It is placed on the skin and must be changed once a week for three weeks and removed on the fourth week. A prescription is needed to use this method of contraception. Vaginal ring A vaginal ring contains hormones that prevent pregnancy. It is placed in the vagina for three weeks and removed on the fourth week. After that, the process is repeated with a new ring. A prescription is needed to use this method of contraception. Emergency contraceptive Emergency contraceptives prevent pregnancy after unprotected sex. They come in pill form and can be taken up to 5 days after sex. They work best the sooner they are taken after having sex. Most emergency contraceptives are available without a prescription. This method should not be used as your only form of birth control.   Barrier methods Female condom A female condom is a thin sheath that is worn over the penis during sex. Condoms keep sperm from going inside a woman's body. They can be used with a sperm-killing substance (spermicide) to increase their  effectiveness. They should be thrown away after one use. Female condom A female condom is a soft, loose-fitting sheath that is put into the vagina before sex. The condom keeps sperm from going inside a woman's body. They should be thrown away after one use. Diaphragm A diaphragm is a soft, dome-shaped barrier. It is inserted into the vagina before sex, along with a spermicide. The diaphragm blocks sperm from entering the uterus, and the spermicide kills sperm. A diaphragm should be left in the vagina for 6-8 hours after sex and removed within 24 hours. A diaphragm is prescribed and fitted by a health care provider. A diaphragm should be replaced every 1-2 years, after giving birth, after gaining more than 15 lb (6.8 kg), and after pelvic surgery. Cervical cap A cervical cap is a round, soft latex or plastic cup that fits over the cervix. It is inserted into the vagina before sex, along with spermicide. It blocks sperm from entering the uterus. The cap should be left in place for 6-8 hours after sex and removed within 48 hours. A cervical cap must be prescribed and fitted by a health care provider. It should be replaced every 2 years. Sponge A sponge is a soft, circular piece of polyurethane foam with spermicide in it. The sponge helps block sperm from entering the uterus, and the spermicide kills sperm. To use it, you make it wet and then insert it into the vagina. It should be inserted before sex, left in for at least 6 hours after sex, and removed and thrown away within 30 hours. Spermicides Spermicides are chemicals that kill or block sperm from entering the   cervix and uterus. They can come as a cream, jelly, suppository, foam, or tablet. A spermicide should be inserted into the vagina with an applicator at least 10-15 minutes before sex to allow time for it to work. The process must be repeated every time you have sex. Spermicides do not require a prescription.   Intrauterine  contraception Intrauterine device (IUD) An IUD is a T-shaped device that is put in a woman's uterus. There are two types:  Hormone IUD.This type contains progestin, a synthetic form of the hormone progesterone. This type can stay in place for 3-5 years.  Copper IUD.This type is wrapped in copper wire. It can stay in place for 10 years. Permanent methods of contraception Female tubal ligation In this method, a woman's fallopian tubes are sealed, tied, or blocked during surgery to prevent eggs from traveling to the uterus. Hysteroscopic sterilization In this method, a small, flexible insert is placed into each fallopian tube. The inserts cause scar tissue to form in the fallopian tubes and block them, so sperm cannot reach an egg. The procedure takes about 3 months to be effective. Another form of birth control must be used during those 3 months. Female sterilization This is a procedure to tie off the tubes that carry sperm (vasectomy). After the procedure, the man can still ejaculate fluid (semen). Another form of birth control must be used for 3 months after the procedure. Natural planning methods Natural family planning In this method, a couple does not have sex on days when the woman could become pregnant. Calendar method In this method, the woman keeps track of the length of each menstrual cycle, identifies the days when pregnancy can happen, and does not have sex on those days. Ovulation method In this method, a couple avoids sex during ovulation. Symptothermal method This method involves not having sex during ovulation. The woman typically checks for ovulation by watching changes in her temperature and in the consistency of cervical mucus. Post-ovulation method In this method, a couple waits to have sex until after ovulation. Where to find more information  Centers for Disease Control and Prevention: www.cdc.gov Summary  Contraception, also called birth control, refers to methods or  devices that prevent pregnancy.  Hormonal methods of contraception include implants, injections, pills, patches, vaginal rings, and emergency contraceptives.  Barrier methods of contraception can include female condoms, female condoms, diaphragms, cervical caps, sponges, and spermicides.  There are two types of IUDs (intrauterine devices). An IUD can be put in a woman's uterus to prevent pregnancy for 3-5 years.  Permanent sterilization can be done through a procedure for males and females. Natural family planning methods involve nothaving sex on days when the woman could become pregnant. This information is not intended to replace advice given to you by your health care provider. Make sure you discuss any questions you have with your health care provider. Document Revised: 07/18/2019 Document Reviewed: 07/18/2019 Elsevier Patient Education  2021 [employer] Inc.   Breastfeeding  Choosing to breastfeed is one of the best decisions you can make for yourself and your baby. A change in hormones during pregnancy causes your breasts to make breast milk in your milk-producing glands. Hormones prevent breast milk from being released before your baby is born. They also prompt milk flow after birth. Once breastfeeding has begun, thoughts of your baby, as well as his or her sucking or crying, can stimulate the release of milk from your milk-producing glands. Benefits of breastfeeding Research shows that breastfeeding offers many health benefits   for infants and mothers. It also offers a cost-free and convenient way to feed your baby. For your baby  Your first milk (colostrum) helps your baby's digestive system to function better.  Special cells in your milk (antibodies) help your baby to fight off infections.  Breastfed babies are less likely to develop asthma, allergies, obesity, or type 2 diabetes. They are also at lower risk for sudden infant death syndrome (SIDS).  Nutrients in breast milk are better  able to meet your baby's needs compared to infant formula.  Breast milk improves your baby's brain development. For you  Breastfeeding helps to create a very special bond between you and your baby.  Breastfeeding is convenient. Breast milk costs nothing and is always available at the correct temperature.  Breastfeeding helps to burn calories. It helps you to lose the weight that you gained during pregnancy.  Breastfeeding makes your uterus return faster to its size before pregnancy. It also slows bleeding (lochia) after you give birth.  Breastfeeding helps to lower your risk of developing type 2 diabetes, osteoporosis, rheumatoid arthritis, cardiovascular disease, and breast, ovarian, uterine, and endometrial cancer later in life. Breastfeeding basics Starting breastfeeding  Find a comfortable place to sit or lie down, with your neck and back well-supported.  Place a pillow or a rolled-up blanket under your baby to bring him or her to the level of your breast (if you are seated). Nursing pillows are specially designed to help support your arms and your baby while you breastfeed.  Make sure that your baby's tummy (abdomen) is facing your abdomen.  Gently massage your breast. With your fingertips, massage from the outer edges of your breast inward toward the nipple. This encourages milk flow. If your milk flows slowly, you may need to continue this action during the feeding.  Support your breast with 4 fingers underneath and your thumb above your nipple (make the letter "C" with your hand). Make sure your fingers are well away from your nipple and your baby's mouth.  Stroke your baby's lips gently with your finger or nipple.  When your baby's mouth is open wide enough, quickly bring your baby to your breast, placing your entire nipple and as much of the areola as possible into your baby's mouth. The areola is the colored area around your nipple. ? More areola should be visible above your  baby's upper lip than below the lower lip. ? Your baby's lips should be opened and extended outward (flanged) to ensure an adequate, comfortable latch. ? Your baby's tongue should be between his or her lower gum and your breast.  Make sure that your baby's mouth is correctly positioned around your nipple (latched). Your baby's lips should create a seal on your breast and be turned out (everted).  It is common for your baby to suck about 2-3 minutes in order to start the flow of breast milk. Latching Teaching your baby how to latch onto your breast properly is very important. An improper latch can cause nipple pain, decreased milk supply, and poor weight gain in your baby. Also, if your baby is not latched onto your nipple properly, he or she may swallow some air during feeding. This can make your baby fussy. Burping your baby when you switch breasts during the feeding can help to get rid of the air. However, teaching your baby to latch on properly is still the best way to prevent fussiness from swallowing air while breastfeeding. Signs that your baby has successfully latched onto   your nipple  Silent tugging or silent sucking, without causing you pain. Infant's lips should be extended outward (flanged).  Swallowing heard between every 3-4 sucks once your milk has started to flow (after your let-down milk reflex occurs).  Muscle movement above and in front of his or her ears while sucking. Signs that your baby has not successfully latched onto your nipple  Sucking sounds or smacking sounds from your baby while breastfeeding.  Nipple pain. If you think your baby has not latched on correctly, slip your finger into the corner of your baby's mouth to break the suction and place it between your baby's gums. Attempt to start breastfeeding again. Signs of successful breastfeeding Signs from your baby  Your baby will gradually decrease the number of sucks or will completely stop sucking.  Your baby  will fall asleep.  Your baby's body will relax.  Your baby will retain a small amount of milk in his or her mouth.  Your baby will let go of your breast by himself or herself. Signs from you  Breasts that have increased in firmness, weight, and size 1-3 hours after feeding.  Breasts that are softer immediately after breastfeeding.  Increased milk volume, as well as a change in milk consistency and color by the fifth day of breastfeeding.  Nipples that are not sore, cracked, or bleeding. Signs that your baby is getting enough milk  Wetting at least 1-2 diapers during the first 24 hours after birth.  Wetting at least 5-6 diapers every 24 hours for the first week after birth. The urine should be clear or pale yellow by the age of 5 days.  Wetting 6-8 diapers every 24 hours as your baby continues to grow and develop.  At least 3 stools in a 24-hour period by the age of 5 days. The stool should be soft and yellow.  At least 3 stools in a 24-hour period by the age of 7 days. The stool should be seedy and yellow.  No loss of weight greater than 10% of birth weight during the first 3 days of life.  Average weight gain of 4-7 oz (113-198 g) per week after the age of 4 days.  Consistent daily weight gain by the age of 5 days, without weight loss after the age of 2 weeks. After a feeding, your baby may spit up a small amount of milk. This is normal. Breastfeeding frequency and duration Frequent feeding will help you make more milk and can prevent sore nipples and extremely full breasts (breast engorgement). Breastfeed when you feel the need to reduce the fullness of your breasts or when your baby shows signs of hunger. This is called "breastfeeding on demand." Signs that your baby is hungry include:  Increased alertness, activity, or restlessness.  Movement of the head from side to side.  Opening of the mouth when the corner of the mouth or cheek is stroked (rooting).  Increased  sucking sounds, smacking lips, cooing, sighing, or squeaking.  Hand-to-mouth movements and sucking on fingers or hands.  Fussing or crying. Avoid introducing a pacifier to your baby in the first 4-6 weeks after your baby is born. After this time, you may choose to use a pacifier. Research has shown that pacifier use during the first year of a baby's life decreases the risk of sudden infant death syndrome (SIDS). Allow your baby to feed on each breast as long as he or she wants. When your baby unlatches or falls asleep while feeding from the   first breast, offer the second breast. Because newborns are often sleepy in the first few weeks of life, you may need to awaken your baby to get him or her to feed. Breastfeeding times will vary from baby to baby. However, the following rules can serve as a guide to help you make sure that your baby is properly fed:  Newborns (babies 4 weeks of age or younger) may breastfeed every 1-3 hours.  Newborns should not go without breastfeeding for longer than 3 hours during the day or 5 hours during the night.  You should breastfeed your baby a minimum of 8 times in a 24-hour period. Breast milk pumping Pumping and storing breast milk allows you to make sure that your baby is exclusively fed your breast milk, even at times when you are unable to breastfeed. This is especially important if you go back to work while you are still breastfeeding, or if you are not able to be present during feedings. Your lactation consultant can help you find a method of pumping that works best for you and give you guidelines about how long it is safe to store breast milk.      Caring for your breasts while you breastfeed Nipples can become dry, cracked, and sore while breastfeeding. The following recommendations can help keep your breasts moisturized and healthy:  Avoid using soap on your nipples.  Wear a supportive bra designed especially for nursing. Avoid wearing underwire-style  bras or extremely tight bras (sports bras).  Air-dry your nipples for 3-4 minutes after each feeding.  Use only cotton bra pads to absorb leaked breast milk. Leaking of breast milk between feedings is normal.  Use lanolin on your nipples after breastfeeding. Lanolin helps to maintain your skin's normal moisture barrier. Pure lanolin is not harmful (not toxic) to your baby. You may also hand express a few drops of breast milk and gently massage that milk into your nipples and allow the milk to air-dry. In the first few weeks after giving birth, some women experience breast engorgement. Engorgement can make your breasts feel heavy, warm, and tender to the touch. Engorgement peaks within 3-5 days after you give birth. The following recommendations can help to ease engorgement:  Completely empty your breasts while breastfeeding or pumping. You may want to start by applying warm, moist heat (in the shower or with warm, water-soaked hand towels) just before feeding or pumping. This increases circulation and helps the milk flow. If your baby does not completely empty your breasts while breastfeeding, pump any extra milk after he or she is finished.  Apply ice packs to your breasts immediately after breastfeeding or pumping, unless this is too uncomfortable for you. To do this: ? Put ice in a plastic bag. ? Place a towel between your skin and the bag. ? Leave the ice on for 20 minutes, 2-3 times a day.  Make sure that your baby is latched on and positioned properly while breastfeeding. If engorgement persists after 48 hours of following these recommendations, contact your health care provider or a lactation consultant. Overall health care recommendations while breastfeeding  Eat 3 healthy meals and 3 snacks every day. Well-nourished mothers who are breastfeeding need an additional 450-500 calories a day. You can meet this requirement by increasing the amount of a balanced diet that you eat.  Drink  enough water to keep your urine pale yellow or clear.  Rest often, relax, and continue to take your prenatal vitamins to prevent fatigue, stress, and low   vitamin and mineral levels in your body (nutrient deficiencies).  Do not use any products that contain nicotine or tobacco, such as cigarettes and e-cigarettes. Your baby may be harmed by chemicals from cigarettes that pass into breast milk and exposure to secondhand smoke. If you need help quitting, ask your health care provider.  Avoid alcohol.  Do not use illegal drugs or marijuana.  Talk with your health care provider before taking any medicines. These include over-the-counter and prescription medicines as well as vitamins and herbal supplements. Some medicines that may be harmful to your baby can pass through breast milk.  It is possible to become pregnant while breastfeeding. If birth control is desired, ask your health care provider about options that will be safe while breastfeeding your baby. Where to find more information: [employer]: www.llli.org Contact a health care provider if:  You feel like you want to stop breastfeeding or have become frustrated with breastfeeding.  Your nipples are cracked or bleeding.  Your breasts are red, tender, or warm.  You have: ? Painful breasts or nipples. ? A swollen area on either breast. ? A fever or chills. ? Nausea or vomiting. ? Drainage other than breast milk from your nipples.  Your breasts do not become full before feedings by the fifth day after you give birth.  You feel sad and depressed.  Your baby is: ? Too sleepy to eat well. ? Having trouble sleeping. ? More than 1 week old and wetting fewer than 6 diapers in a 24-hour period. ? Not gaining weight by 5 days of age.  Your baby has fewer than 3 stools in a 24-hour period.  Your baby's skin or the white parts of his or her eyes become yellow. Get help right away if:  Your baby is overly tired  (lethargic) and does not want to wake up and feed.  Your baby develops an unexplained fever. Summary  Breastfeeding offers many health benefits for infant and mothers.  Try to breastfeed your infant when he or she shows early signs of hunger.  Gently tickle or stroke your baby's lips with your finger or nipple to allow the baby to open his or her mouth. Bring the baby to your breast. Make sure that much of the areola is in your baby's mouth. Offer one side and burp the baby before you offer the other side.  Talk with your health care provider or lactation consultant if you have questions or you face problems as you breastfeed. This information is not intended to replace advice given to you by your health care provider. Make sure you discuss any questions you have with your health care provider. Document Revised: 05/07/2017 Document Reviewed: 03/14/2016 Elsevier Patient Education  2021 Elsevier Inc.  

## 2020-04-18 NOTE — Progress Notes (Signed)
   Subjective:  Amanda Burch is a 20 y.o. G1P0 at [redacted]w[redacted]d being seen today for ongoing prenatal care.  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  Patient reports no complaints.  Contractions: Not present. Vag. Bleeding: None.  Movement: Present. Denies leaking of fluid.   The following portions of the patient's history were reviewed and updated as appropriate: allergies, current medications, past family history, past medical history, past social history, past surgical history and problem list. Problem list updated.  Objective:   Vitals:   04/18/20 1337  BP: 124/73  Pulse: 88  Weight: 207 lb 14.4 oz (94.3 kg)    Fetal Status: Fetal Heart Rate (bpm): NST   Movement: Present     General:  Alert, oriented and cooperative. Patient is in no acute distress.  Skin: Skin is warm and dry. No rash noted.   Cardiovascular: Normal heart rate noted  Respiratory: Normal respiratory effort, no problems with respiration noted  Abdomen: Soft, gravid, appropriate for gestational age. Pain/Pressure: Absent     Pelvic: Vag. Bleeding: None     Cervical exam deferred        Extremities: Normal range of motion.  Edema: Trace  Mental Status: Normal mood and affect. Normal behavior. Normal judgment and thought content.   Urinalysis:      Assessment and Plan:  Pregnancy: G1P0 at [redacted]w[redacted]d  1. Supervision of high risk pregnancy, antepartum BP normal, NST reactive Attempted to place foley bulb but patient unable to tolerate even cervical exam IOL scheduled for tonight at midnight  2. Sickle cell disease without crisis (HCC) No symptoms currently  3. GBS bacteriuria   Term labor symptoms and general obstetric precautions  including but not limited to vaginal bleeding, contractions, leaking of fluid and fetal movement were reviewed in detail with the patient. Please refer to After Visit Summary for other counseling recommendations.  Return in 6 weeks (on 05/30/2020) for PP check.   Venora Maples, MD

## 2020-04-18 NOTE — Progress Notes (Signed)
[place] for growth and BPP done on 2/21.

## 2020-04-19 ENCOUNTER — Inpatient Hospital Stay (HOSPITAL_COMMUNITY): Payer: Medicaid Other

## 2020-04-19 ENCOUNTER — Encounter (HOSPITAL_COMMUNITY): Payer: Self-pay | Admitting: Obstetrics & Gynecology

## 2020-04-19 ENCOUNTER — Inpatient Hospital Stay (HOSPITAL_COMMUNITY)
Admission: AD | Admit: 2020-04-19 | Discharge: 2020-04-26 | DRG: 786 | Disposition: A | Discharge status: Home / Self Care | Payer: Medicaid Other | Attending: Obstetrics & Gynecology | Admitting: Obstetrics & Gynecology

## 2020-04-19 DIAGNOSIS — A749 Chlamydial infection, unspecified: Secondary | ICD-10-CM | POA: Diagnosis not present

## 2020-04-19 DIAGNOSIS — E669 Obesity, unspecified: Secondary | ICD-10-CM | POA: Diagnosis present

## 2020-04-19 DIAGNOSIS — O9902 Anemia complicating childbirth: Secondary | ICD-10-CM | POA: Diagnosis present

## 2020-04-19 DIAGNOSIS — O9903 Anemia complicating the puerperium: Secondary | ICD-10-CM | POA: Diagnosis not present

## 2020-04-19 DIAGNOSIS — O98812 Other maternal infectious and parasitic diseases complicating pregnancy, second trimester: Secondary | ICD-10-CM | POA: Diagnosis present

## 2020-04-19 DIAGNOSIS — O0930 Supervision of pregnancy with insufficient antenatal care, unspecified trimester: Secondary | ICD-10-CM

## 2020-04-19 DIAGNOSIS — O99214 Obesity complicating childbirth: Secondary | ICD-10-CM | POA: Diagnosis present

## 2020-04-19 DIAGNOSIS — O368330 Maternal care for abnormalities of the fetal heart rate or rhythm, third trimester, not applicable or unspecified: Secondary | ICD-10-CM | POA: Diagnosis not present

## 2020-04-19 DIAGNOSIS — D571 Sickle-cell disease without crisis: Secondary | ICD-10-CM | POA: Diagnosis present

## 2020-04-19 DIAGNOSIS — O1414 Severe pre-eclampsia complicating childbirth: Secondary | ICD-10-CM | POA: Diagnosis present

## 2020-04-19 DIAGNOSIS — Q8901 Asplenia (congenital): Secondary | ICD-10-CM

## 2020-04-19 DIAGNOSIS — Z3A39 39 weeks gestation of pregnancy: Secondary | ICD-10-CM

## 2020-04-19 DIAGNOSIS — D5700 Hb-SS disease with crisis, unspecified: Secondary | ICD-10-CM | POA: Diagnosis not present

## 2020-04-19 DIAGNOSIS — O0933 Supervision of pregnancy with insufficient antenatal care, third trimester: Secondary | ICD-10-CM | POA: Diagnosis not present

## 2020-04-19 DIAGNOSIS — O0990 Supervision of high risk pregnancy, unspecified, unspecified trimester: Secondary | ICD-10-CM | POA: Diagnosis not present

## 2020-04-19 DIAGNOSIS — R8271 Bacteriuria: Secondary | ICD-10-CM | POA: Diagnosis present

## 2020-04-19 DIAGNOSIS — O99820 Streptococcus B carrier state complicating pregnancy: Secondary | ICD-10-CM | POA: Diagnosis not present

## 2020-04-19 DIAGNOSIS — Z79899 Other long term (current) drug therapy: Secondary | ICD-10-CM | POA: Diagnosis not present

## 2020-04-19 DIAGNOSIS — D730 Hyposplenism: Secondary | ICD-10-CM

## 2020-04-19 DIAGNOSIS — O99824 Streptococcus B carrier state complicating childbirth: Secondary | ICD-10-CM | POA: Diagnosis present

## 2020-04-19 LAB — RPR: RPR Ser Ql: NONREACTIVE

## 2020-04-19 LAB — PATHOLOGIST SMEAR REVIEW

## 2020-04-19 LAB — CBC
HCT: 28.6 % — ABNORMAL LOW (ref 36.0–46.0)
Hemoglobin: 10.4 g/dL — ABNORMAL LOW (ref 12.0–15.0)
MCH: 31.8 pg (ref 26.0–34.0)
MCHC: 36.4 g/dL — ABNORMAL HIGH (ref 30.0–36.0)
MCV: 87.5 fL (ref 80.0–100.0)
Platelets: 339 10*3/uL (ref 150–400)
RBC: 3.27 MIL/uL — ABNORMAL LOW (ref 3.87–5.11)
RDW: 14.5 % (ref 11.5–15.5)
WBC: 11.1 10*3/uL — ABNORMAL HIGH (ref 4.0–10.5)
nRBC: 5 % — ABNORMAL HIGH (ref 0.0–0.2)

## 2020-04-19 MED ORDER — SODIUM CHLORIDE 0.9 % IV SOLN
5.0000 10*6.[IU] | Freq: Once | INTRAVENOUS | Status: AC
  Administered 2020-04-19: 5 10*6.[IU] via INTRAVENOUS
  Filled 2020-04-19: qty 5

## 2020-04-19 MED ORDER — MISOPROSTOL 50MCG HALF TABLET
50.0000 ug | ORAL_TABLET | ORAL | Status: DC | PRN
  Administered 2020-04-19 – 2020-04-20 (×3): 50 ug via BUCCAL
  Filled 2020-04-19 (×3): qty 1

## 2020-04-19 MED ORDER — OXYCODONE-ACETAMINOPHEN 5-325 MG PO TABS: 1.0000 | ORAL_TABLET | ORAL | Status: DC | PRN

## 2020-04-19 MED ORDER — LIDOCAINE HCL (PF) 1 % IJ SOLN: 30.0000 mL | INTRAMUSCULAR | Status: DC | PRN

## 2020-04-19 MED ORDER — NALOXONE HCL 0.4 MG/ML IJ SOLN
INTRAMUSCULAR | Status: AC
  Administered 2020-04-19: 0.4 mg
  Filled 2020-04-19: qty 1

## 2020-04-19 MED ORDER — BUTORPHANOL TARTRATE 1 MG/ML IJ SOLN
2.0000 mg | INTRAMUSCULAR | Status: DC | PRN
  Administered 2020-04-19: 2 mg via INTRAVENOUS
  Filled 2020-04-19: qty 2

## 2020-04-19 MED ORDER — ONDANSETRON HCL 4 MG/2ML IJ SOLN
4.0000 mg | Freq: Four times a day (QID) | INTRAMUSCULAR | Status: DC | PRN
  Administered 2020-04-20 – 2020-04-21 (×3): 4 mg via INTRAVENOUS
  Filled 2020-04-19 (×3): qty 2

## 2020-04-19 MED ORDER — PROMETHAZINE HCL 25 MG/ML IJ SOLN
12.5000 mg | Freq: Once | INTRAMUSCULAR | Status: AC
  Administered 2020-04-19: 12.5 mg via INTRAVENOUS
  Filled 2020-04-19: qty 1

## 2020-04-19 MED ORDER — OXYCODONE-ACETAMINOPHEN 5-325 MG PO TABS: 2.0000 | ORAL_TABLET | ORAL | Status: DC | PRN

## 2020-04-19 MED ORDER — SOD CITRATE-CITRIC ACID 500-334 MG/5ML PO SOLN
30.0000 mL | ORAL | Status: DC | PRN
  Filled 2020-04-19: qty 15

## 2020-04-19 MED ORDER — OXYTOCIN BOLUS FROM INFUSION: 333.0000 mL | Freq: Once | INTRAVENOUS | Status: DC

## 2020-04-19 MED ORDER — MISOPROSTOL 25 MCG QUARTER TABLET
ORAL_TABLET | ORAL | Status: AC
  Administered 2020-04-19: 25 ug via VAGINAL
  Filled 2020-04-19: qty 1

## 2020-04-19 MED ORDER — ACETAMINOPHEN 325 MG PO TABS: 650.0000 mg | ORAL_TABLET | ORAL | Status: DC | PRN

## 2020-04-19 MED ORDER — LACTATED RINGERS IV SOLN: INTRAVENOUS | Status: DC

## 2020-04-19 MED ORDER — LACTATED RINGERS IV SOLN
500.0000 mL | INTRAVENOUS | Status: DC | PRN
  Administered 2020-04-21: 1000 mL via INTRAVENOUS

## 2020-04-19 MED ORDER — MISOPROSTOL 25 MCG QUARTER TABLET: 25.0000 ug | ORAL_TABLET | Freq: Once | ORAL | Status: AC

## 2020-04-19 MED ORDER — NALOXONE HCL 0.4 MG/ML IJ SOLN: 0.4000 mg | Freq: Once | INTRAMUSCULAR | Status: DC

## 2020-04-19 MED ORDER — MISOPROSTOL 50MCG HALF TABLET: 50.0000 ug | ORAL_TABLET | ORAL | Status: DC

## 2020-04-19 MED ORDER — OXYTOCIN-SODIUM CHLORIDE 30-0.9 UT/500ML-% IV SOLN
2.5000 [IU]/h | INTRAVENOUS | Status: DC
  Filled 2020-04-19: qty 500

## 2020-04-19 MED ORDER — FENTANYL CITRATE (PF) 100 MCG/2ML IJ SOLN
100.0000 ug | INTRAMUSCULAR | Status: DC | PRN
  Administered 2020-04-19 – 2020-04-21 (×7): 100 ug via INTRAVENOUS
  Filled 2020-04-19 (×8): qty 2

## 2020-04-19 MED ORDER — PENICILLIN G POT IN DEXTROSE 60000 UNIT/ML IV SOLN
3.0000 10*6.[IU] | INTRAVENOUS | Status: DC
  Administered 2020-04-19 – 2020-04-21 (×12): 3 10*6.[IU] via INTRAVENOUS
  Filled 2020-04-19 (×12): qty 50

## 2020-04-19 MED ORDER — MISOPROSTOL 50MCG HALF TABLET
ORAL_TABLET | ORAL | Status: AC
  Administered 2020-04-19: 50 ug via BUCCAL
  Filled 2020-04-19: qty 1

## 2020-04-19 MED ORDER — TERBUTALINE SULFATE 1 MG/ML IJ SOLN: 0.2500 mg | Freq: Once | INTRAMUSCULAR | Status: DC | PRN

## 2020-04-19 MED ORDER — FENTANYL CITRATE (PF) 100 MCG/2ML IJ SOLN: 100.0000 ug | INTRAMUSCULAR | Status: DC | PRN

## 2020-04-19 NOTE — Progress Notes (Signed)
Labor Progress Note Amanda Burch is a 20 y.o. G1P0000 at [redacted]w[redacted]d presented for IOL for SCD  S:  Comfortable, no c/o. Wants to proceed with IOL.  O:  BP (!) 110/57   Pulse 78   Temp 98 F (36.7 C) (Oral)   Resp 18   Ht 5\' 4"  (1.626 m)   Wt 96.6 kg   LMP 09/02/2019   BMI 36.54 kg/m  EFM: baseline 140 bpm/ mod variability/ + accels/ no decels  Toco/IUPC: irregular SVE: Dilation: Closed Effacement (%): Thick Cervical Position: Posterior Station: -3 Presentation: Vertex Exam by:: [email or web address] CNM   A/P: 20 y.o. G1P0000 [redacted]w[redacted]d  1. Labor: latent 2. FWB: Cat I 3. Pain: analgesia prn  Consented for FP placement. Pre-medicated with Fentanyl. Attempted cook cath placement without and with speculum and was unsuccessful. Will continue Cytotec.   [redacted]w[redacted]d, CNM 4:55 PM

## 2020-04-19 NOTE — Progress Notes (Signed)
Pt and partner with questions regarding why the induction is taking so long and possibly going home and waiting for spontaneous labor. They are also concerned about the hospital bill being higher d/t longer LOS. Discussed the reason for her IOL is to avoid another Grady crisis prior to or during labor and birth. It would be reasonable to go home and await spontaneous labor but waiting (more time pregnant) may increase the risk of another crisis since pregnancy is stressful on the body. I also informed them that pregnancy and birth are usually billed as a global charge although I do not know the specifics of what their financial responsibility will be. They will discuss among themselves and notify us of their desired plan.

## 2020-04-19 NOTE — Progress Notes (Signed)
Prior to prepping for procedure, Thereasa Burch discussed options in-depth, including discharge for later induction date given no current pain crises and stable CBC. In addition, discussed options of repeat medicated attempted foley balloon placement with either IV medications versus early epidural. Provided pt and her partner over 1 hour to discuss in private s/p conversation. Upon author's return pt elected for repeat FB placement attempt.   Author at bedside several minutes after administration of stadol 2mg  and phenergan 12.5mg  in preparation for difficult foley balloon placement to facilitate induction for sickle cell disease. Several minutes s/p positioning pt in dorsal lithotomy in preparation for procedure. Pt abruptly attempted to sit up and became very sleepy. In addition, she became very sleepy and had difficulty responding to questions with slurred speech. She was noted to have stable vital signs and 100% oxygen saturation. OB pharmacist was consulted and presented to the room. Decision to give narcan 0.4mg  IV; administered with minimal change in pt's presentation. Pt did become slightly more alert with improvement to engage in conversation. Discussed possibility of rare adverse reaction to phenergan. STAT labs obtained including VBG (given potential for CO2 retention with stadol), CBC and CMP. EKG also ordered. Charge nurse and pt's nurse also at bedside. Will defer procedure at this time and allow pt to wake up. Pt clinically stable prior to author leaving the room. No signs of anaphylaxis or respiratory distress on exam. No focal neurologic deficits.  , MD OB Fellow, [facility] 04/20/2020 12:02 AM

## 2020-04-19 NOTE — H&P (Addendum)
OBSTETRIC ADMISSION HISTORY AND PHYSICAL  Amanda Burch is a 20 y.o. female G1P0 with IUP at [redacted]w[redacted]d by 2nd trimester ultrasound ([redacted]w[redacted]d) presenting for IOL due to sickle cell disease. She reports +FMs, No LOF, no VB, no blurry vision, headaches or peripheral edema, and RUQ pain.  She plans on breast and bottle feeding. She is undecided for birth control (current plan is for condoms but would like to discuss IUD) She received her prenatal care at [facility]   Dating: By 27w ultrasound --->  Estimated Date of Delivery: 04/25/20  Sono:   @[redacted]w[redacted]d , normal anatomy, cephalic presentation, 3328g, EFW   Prenatal History/Complications: sickle cell disease, functional asplenia, GBS bacteriuria, chlamydia during 2nd trimester, late PNC (27 weeks)  Past Medical History: Past Medical History:  Diagnosis Date   Sickle cell anemia (HCC)    Sickle cell disease with crisis (HCC) 03/31/2014   Vision abnormalities     Past Surgical History: Past Surgical History:  Procedure Laterality Date   NO PAST SURGERIES      Obstetrical History: OB History     Gravida  1   Para      Term      Preterm      AB      Living         SAB      IAB      Ectopic      Multiple      Live Births              Social History Social History   Socioeconomic History   Marital status: Single    Spouse name: Not on file   Number of children: Not on file   Years of education: Not on file   Highest education level: Not on file  Occupational History   Not on file  Tobacco Use   Smoking status: Never Smoker   Smokeless tobacco: Never Used  Vaping Use   Vaping Use: Never used  Substance and Sexual Activity   Alcohol use: No   Drug use: No   Sexual activity: Yes    Birth control/protection: None  Other Topics Concern   Not on file  Social History Narrative   Not on file   Social Determinants of Health   Financial Resource Strain: Not on file  Food Insecurity: Food Insecurity Present   Worried  About Running Out of Food in the Last Year: Sometimes true   Ran Out of Food in the Last Year: Sometimes true  Transportation Needs: Unmet Transportation Needs   Lack of Transportation (Medical): No   Lack of Transportation (Non-Medical): Yes  Physical Activity: Not on file  Stress: Not on file  Social Connections: Not on file    Family History: Family History  Adopted: Yes  Problem Relation Age of Onset   Asthma Mother    Sarcoidosis Mother    Deep vein thrombosis Mother     Allergies: No Known Allergies  Medications Prior to Admission  Medication Sig Dispense Refill Last Dose   acetaminophen (TYLENOL) 500 MG tablet Take 1 tablet (500 mg total) by mouth every 6 (six) hours as needed. (Patient not taking: Reported on 04/18/2020) 30 tablet 2    polyethylene glycol (MIRALAX / GLYCOLAX) packet Take 136 g by mouth once. 136g (8 packet) in 32oz of fluid. (Patient not taking: No sig reported) 14 each 0    Prenatal Vit-Fe Fumarate-FA (PREPLUS) 27-1 MG TABS Take 1 tablet by mouth daily. (Patient not taking:  Reported on 04/18/2020) 30 tablet 13      Review of Systems   All systems reviewed and negative except as stated in HPI  Last menstrual period 09/02/2019. General appearance: alert, cooperative and no distress Lungs: clear to auscultation bilaterally Heart: regular rate and rhythm Abdomen: soft, non-tender Pelvic: cervix closed and thick per RN exam Extremities: no sign of DVT Presentation: cephalic by bedside u/s Fetal monitoringBaseline: 135 bpm, Variability: Good {> 6 bpm), Accelerations: Reactive and Decelerations: Absent Uterine activity: irregular    Prenatal labs: ABO, Rh: A/Positive/-- (12/09 0925) Antibody: Negative (12/09 0925) Rubella: 3.38 (12/09 0925) RPR: Non Reactive (12/09 0925)  HBsAg: Negative (12/09 0925)  HIV: Non Reactive (12/09 0925)  GBS:   GBS bacteruria at 27w Early 3rd trimester GTT: normal (89-109-78) Genetic screening  LR NIPS, +2 variants  assoc w/beta hemaglobinopathy Anatomy US normal  Prenatal Transfer Tool  Maternal Diabetes: No Genetic Screening: Normal Maternal Ultrasounds/Referrals: Normal Fetal Ultrasounds or other Referrals:  None Maternal Substance Abuse:  No Significant Maternal Medications:  None (opioids for pain crisis on 03/26/20) Significant Maternal Lab Results: Group B Strep positive, chlamydia during 2nd trimester (TOC negative on 03/30/2020)  No results found for this or any previous visit (from the past 24 hour(s)).  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 G1P0 at [redacted]w[redacted]d here for IOL due to sickle cell disease. Pregnancy complicated by late PNC (27 weeks), GBS bacteriuria, chlamydia during 2nd trimester.  #IOL: Cervix closed and thick on admission; will give buccal cytotec x1 with plans to attempt FB at next cervical exam. #Pain: Prn per patient request #FWB: Cat 1 strip #ID: GBS+; initiate PCN on admission. Hx of chlamydia during 2nd trimester with negative TOC on 03/30/2020. #MOF: breast/bottle #MOC: undecided (condoms vs. IUD s/p counseling) #Circ: desired #Late PNC: SW consult #Sickle cell anemia: baseline hgb ~10, not currently in pain crisis  Maury Dus, MD  04/19/2020, 12:23 AM  Attestation of Supervision of Student:  I confirm that I have verified the information documented in the  resident's  note and that I have also personally reperformed the history, physical exam and all medical decision making activities.  I have verified that all services and findings are accurately documented in this student's note; and I agree with management and plan as outlined in the  documentation. I have also made any necessary editorial changes.  Sheila Oats, MD Center for [facility], [facility] 04/19/2020 2:59 AM

## 2020-04-19 NOTE — Progress Notes (Signed)
LABOR PROGRESS NOTE  Amanda Burch is a 20 y.o. G1P0000 at [redacted]w[redacted]d  admitted for IOL secondary to sickle cell disease.  Subjective: Patient doing well overall. Comfortable. Not feeling any contractions. She denies complaints at this time.  Objective: BP 134/68   Pulse 83   Temp 98 F (36.7 C) (Axillary)   Resp 16   Ht 5\' 4"  (1.626 m)   Wt 96.6 kg   LMP 09/02/2019   BMI 36.54 kg/m  or  Vitals:   04/19/20 0154 04/19/20 0219 04/19/20 0444 04/19/20 0700  BP: 126/70 116/66 118/79 134/68  Pulse: 84 70 74 83  Resp: 16  18 16   Temp: 98.3 F (36.8 C)  98 F (36.7 C)   TempSrc: Oral  Axillary   Weight: 96.6 kg     Height: 5\' 4"  (1.626 m)        Dilation: Closed Effacement (%): Thick Cervical Position: Posterior Station: -3 Presentation: Vertex Exam by:: Dr. FHT: baseline rate 135, moderate varibility, no acels, no decels Toco: irregular  Labs: Lab Results  Component Value Date   WBC 11.1 (H) 04/19/2020   HGB 10.4 (L) 04/19/2020   HCT 28.6 (L) 04/19/2020   MCV 87.5 04/19/2020   PLT 339 04/19/2020    Patient Active Problem List   Diagnosis Date Noted  . Supervision of high risk pregnancy, antepartum 02/08/2020  . Sickle cell anemia (HCC)   . Late prenatal care complicating pregnancy   . Chlamydia infection affecting pregnancy in second trimester 02/02/2020  . GBS bacteriuria 01/30/2020  . 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 G1P0000 at [redacted]w[redacted]d here for IOL secondary to sickle cell disease.  IOL: cervix remains closed after cytotec x1. Dr. 06/14/2014 attempted FB x2 with speculum exam but was unsuccessful. Will give additional cytotec and recheck in 4 hrs Fetal Wellbeing:  Cat 1 strip Pain Control:  Prn per patient request Anticipated MOD:  Vaginal  Remainder per H&P.  12, MD PGY-1 Family Medicine  04/19/2020, 7:26 AM

## 2020-04-19 NOTE — Progress Notes (Signed)
Labor Progress Note Zaynah Chawla is a 20 y.o. G1P0000 at [redacted]w[redacted]d presented for IOL for SCD  S:  Comfortable, no c/p. S/p Cytotec x2  O:  BP 130/70   Pulse 70   Temp 98 F (36.7 C) (Axillary)   Resp 16   Ht 5\' 4"  (1.626 m)   Wt 96.6 kg   LMP 09/02/2019   BMI 36.54 kg/m  EFM: baseline 135 bpm/ mod variability/ + accels/ no decels  Toco/IUPC: irreg SVE: Dilation: Closed Effacement (%): Thick Cervical Position: Posterior Station: -3 Presentation: Vertex Exam by:: Bhambri  A/P: 20 y.o. G1P0000 [redacted]w[redacted]d  1. Labor: latent 2. FWB: Cat I 3. Pain: analgesia prn  Continue Cytotec. Attempt FB placement when more dilated, recommend pre-medication d/t intolerance of exams. Anticipate labor progress and SVD.  [redacted]w[redacted]d, CNM 11:07 AM

## 2020-04-20 LAB — COMPREHENSIVE METABOLIC PANEL
ALT: 16 U/L (ref 0–44)
AST: 32 U/L (ref 15–41)
Albumin: 2.9 g/dL — ABNORMAL LOW (ref 3.5–5.0)
Alkaline Phosphatase: 186 U/L — ABNORMAL HIGH (ref 38–126)
Anion gap: 8 (ref 5–15)
BUN: <5 mg/dL — ABNORMAL LOW (ref 6–20)
CO2: 22 mmol/L (ref 22–32)
Calcium: 8.9 mg/dL (ref 8.9–10.3)
Chloride: 107 mmol/L (ref 98–111)
Creatinine, Ser: 0.66 mg/dL (ref 0.44–1.00)
GFR, Estimated: >60 mL/min (ref >60)
Glucose, Bld: 77 mg/dL (ref 70–99)
Potassium: 3.7 mmol/L (ref 3.5–5.1)
Sodium: 137 mmol/L (ref 135–145)
Total Bilirubin: 0.8 mg/dL (ref 0.3–1.2)
Total Protein: 6.5 g/dL (ref 6.5–8.1)

## 2020-04-20 LAB — CBC
HCT: 28.5 % — ABNORMAL LOW (ref 36.0–46.0)
Hemoglobin: 10.7 g/dL — ABNORMAL LOW (ref 12.0–15.0)
MCH: 32.3 pg (ref 26.0–34.0)
MCHC: 37.5 g/dL — ABNORMAL HIGH (ref 30.0–36.0)
MCV: 86.1 fL (ref 80.0–100.0)
Platelets: 302 10*3/uL (ref 150–400)
RBC: 3.31 MIL/uL — ABNORMAL LOW (ref 3.87–5.11)
RDW: 14.6 % (ref 11.5–15.5)
WBC: 11.5 10*3/uL — ABNORMAL HIGH (ref 4.0–10.5)
nRBC: 4.3 % — ABNORMAL HIGH (ref 0.0–0.2)

## 2020-04-20 LAB — BLOOD GAS, VENOUS
Acid-base deficit: 1.6 mmol/L (ref 0.0–2.0)
Bicarbonate: 21.9 mmol/L (ref 20.0–28.0)
Drawn by: 3909
FIO2: 21
O2 Saturation: 88.6 %
Patient temperature: 37
pCO2, Ven: 32.8 mmHg — ABNORMAL LOW (ref 44.0–60.0)
pH, Ven: 7.44 — ABNORMAL HIGH (ref 7.250–7.430)
pO2, Ven: 57.9 mmHg — ABNORMAL HIGH (ref 32.0–45.0)

## 2020-04-20 NOTE — Progress Notes (Signed)
LABOR PROGRESS NOTE  Amanda Burch is a 20 y.o. G1P0000 at [redacted]w[redacted]d  admitted for IOL due to sickle cell disease.  Subjective: Patient reporting some nausea and overall discomfort. Just recently received Zofran with slight improvement. Nate and patient's sister at bedside. All questions answered.  Objective: BP (!) 135/110 (BP Location: Left Arm)   Pulse 79   Temp 98 F (36.7 C) (Oral)   Resp 17   Ht 5\' 4"  (1.626 m)   Wt 96.6 kg   LMP 09/02/2019   SpO2 98%   BMI 36.54 kg/m  or  Vitals:   04/20/20 1411 04/20/20 1804 04/20/20 1945 04/20/20 1949  BP: 131/78 134/71  (!) 135/110  Pulse: 72 70  79  Resp: 18 16  17   Temp: 98.1 F (36.7 C) 98.6 F (37 C) 98 F (36.7 C)   TempSrc: Oral Oral Oral   SpO2:      Weight:      Height:        Dilation: 1 Effacement (%): 60 Cervical Position: Posterior Station: Ballotable Presentation: Vertex Exam by:: , CNM FHT: baseline rate 150, moderate varibility, acels present, no decels Toco: q6-10 mins  Labs: Lab Results  Component Value Date   WBC 11.5 (H) 04/19/2020   HGB 10.7 (L) 04/19/2020   HCT 28.5 (L) 04/19/2020   MCV 86.1 04/19/2020   PLT 302 04/19/2020    Patient Active Problem List   Diagnosis Date Noted  . Indication for care in labor or delivery 04/19/2020  . Supervision of high risk pregnancy, antepartum 02/08/2020  . Sickle cell anemia (HCC)   . Late prenatal care complicating pregnancy   . Chlamydia infection affecting pregnancy in second trimester 02/02/2020  . GBS bacteriuria 01/30/2020  . 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 G1P0000 at [redacted]w[redacted]d here for IOL due to sickle cell disease.  IOL: Cervix 1/60/ballotable on most recent exam. FB in place. Will give Cytotec now and continue to monitor Fetal Wellbeing:  Cat I Pain Control:  Prn per patient request GBS positive: Adequately treated Anticipated MOD:  NSVD   12, MD PGY-1 Family Medicine  Resident 04/20/2020, 9:07 PM

## 2020-04-20 NOTE — Progress Notes (Signed)
Labor Progress Note Amanda Burch is a 20 y.o. G1P0000 at [redacted]w[redacted]d presented for IOL for SCD  S:  Pt sitting in the chair, feeling more awake. Nauseated but declines medication. FOB at bedside for support.   O:  BP 122/90   Pulse 87   Temp 98.3 F (36.8 C) (Oral)   Resp 19   Ht 5\' 4"  (1.626 m)   Wt 212 lb 14.4 oz (96.6 kg)   LMP 09/02/2019   SpO2 98%   BMI 36.54 kg/m  EFM: baseline 145 bpm/ minimal to moderate variability/ + accels/ no decels (now - was minimal, holding cytotec until variability NST fully reactive) Toco/IUPC: irregular, UI SVE: Dilation: Fingertip Effacement (%): 70 Cervical Position: Posterior Station: -3 Presentation: Vertex Exam by:: Dr. [email or web address]  A/P: 20 y.o. G1P0000 [redacted]w[redacted]d  1. Labor: latent, FB in place, will give 2nd dose of cytotec if FHR tracing remains stable 2. FWB: Cat 1 3. Pain: no complaints of pain, not feeling contractions 4. SCD: no s/sx of exacerbation  Continue position changes and tension on FB. Anticipate SVD.  [redacted]w[redacted]d, CNM, MSN, IBCLC Certified Nurse Midwife, [facility]

## 2020-04-20 NOTE — Progress Notes (Signed)
Amanda Burch is a 20 y.o. G1P0000 at [redacted]w[redacted]d  Subjective: S/p hygiene and snack break  Objective: BP 134/71    Pulse 70    Temp 98.6 F (37 C) (Oral)    Resp 16    Ht 5\' 4"  (1.626 m)    Wt 96.6 kg    LMP 09/02/2019    SpO2 98%    BMI 36.54 kg/m  I/O last 3 completed shifts: In: 720 [P.O.:720] Out: -  No intake/output data recorded.  FHT:  FHR: 140 bpm, variability: moderate,  accelerations:  Present,  decelerations:  Absent UC:   irregular, every 4-7 minutes SVE:   Dilation: 1 Effacement (%): 60 Station: Ballotable Exam by:: [email or web address], CNM  Labs: Lab Results  Component Value Date   WBC 11.5 (H) 04/19/2020   HGB 10.7 (L) 04/19/2020   HCT 28.5 (L) 04/19/2020   MCV 86.1 04/19/2020   PLT 302 04/19/2020    Assessment / Plan: --20 y.o. G1P0000 at [redacted]w[redacted]d  --GBS POS, adequately treated --Cat I tracing --Previously observed decelerations s/p Cytotec --Second foley bulb placed with IV Fentanyl for comfort on board --Discussed likelihood of Cytotec or Low dose Pitocin if no active contractions with FB alone --Anticipate NSVD  [redacted]w[redacted]d, CNM 04/20/2020, 7:02 PM

## 2020-04-20 NOTE — Progress Notes (Signed)
Amanda Burch is a 20 y.o. G1P0000 at [redacted]w[redacted]d   Subjective: Vocalizing through contractions, rocking side to side at bedside. Requesting IV pain medicine to facilitate FB status  Objective: BP 122/90   Pulse 87   Temp 98.3 F (36.8 C) (Oral)   Resp 19   Ht 5\' 4"  (1.626 m)   Wt 96.6 kg   LMP 09/02/2019   SpO2 98%   BMI 36.54 kg/m  I/O last 3 completed shifts: In: 720 [P.O.:720] Out: -  No intake/output data recorded.  FHT:  FHR: 145 bpm, variability: moderate,  accelerations:  Present,  decelerations:  Absent UC:   occasional SVE:   Dilation: 1.5 Effacement (%): 60 Station: -3 Exam by:: [email or web address], CNM  Labs: Lab Results  Component Value Date   WBC 11.5 (H) 04/19/2020   HGB 10.7 (L) 04/19/2020   HCT 28.5 (L) 04/19/2020   MCV 86.1 04/19/2020   PLT 302 04/19/2020    Assessment / Plan: --20 y.o. G1P0000 at [redacted]w[redacted]d  --Cat I tracing --IV Fentanyl administered and effective prior to exam --FB removed by CNM --Cervix now 1-1.5/60/-3 --Patient intermittently snoring s/p Fentanyl --Plan made to nap for about 45 minutes, snack and shower then potentially place another FB --Anticipate NSVD  05-19-1986, CNM 04/20/2020, 1:30 PM

## 2020-04-20 NOTE — Progress Notes (Signed)
Care assumed of patient at 1225. Report received from Tyler Aas, CNM. Tracing and labor status reviewed with bedside nurse Corrie Dandy. Will cluster care, intro myself to patient and discuss foley bulb status when RN returns to bedside in just under one hour.  Clayton Bibles, MSN, CNM Certified Nurse Midwife, [employer] for [employer], [facility] 04/20/20 1:00 PM

## 2020-04-20 NOTE — Progress Notes (Signed)
Labor Progress Note Amanda Burch is a 20 y.o. G1P0000 at [redacted]w[redacted]d presented for IOL for SCD.  S: Pt more alert and able to request that we proceed with attempt at foley balloon placement. No concerns at this time. Pt's partner at bedside.  O:  BP 116/64 (BP Location: Right Arm)   Pulse 69   Temp 98.7 F (37.1 C) (Oral)   Resp 17   Ht 5\' 4"  (1.626 m)   Wt 96.6 kg   LMP 09/02/2019   SpO2 99%   BMI 36.54 kg/m  EFM: baseline 150 bpm/ min-mod variability/ +accels/ intermittent variable decels Toco: ctx every 3-4 minutes SVE: Dilation: Fingertip Effacement (%): 70 Cervical Position: Posterior Station: -3 Presentation: Vertex Exam by:: Dr. [email or web address]   A/P: 20 y.o. G1P0000 [redacted]w[redacted]d presented for IOL for SCD.  #IOL: Cervix closed and thick on admission; now s/p cytotec x4 with minimal cervical dilation. Per significant event note, given several failed attempts at [facility] placement, discussed options of hospital discharge for induction at later date, re-attempt at [facility] with IV meds or early epidural. Pt elected for re-attempt at [facility] with stadol+phenergan. Given hypersensitivity reaction, need to defer re-attempt at [facility] given given pt's extreme drowsiness and inability to provide consent for additional procedures. Reassuringly, FB now in place. Will defer repeat cytotec dose at this time given Category 2 strip. #Pain: s/p stadol+phenergan at 2300 as noted above #FWB: Cat 2 strip given min-moderate variability; reassuringly, +accels and good recovery s/p decels. #ID: GBS+; continue PCN--now adequate #MOF: breast/bottl #MOC: undecided (condoms vs. IUD s/p counseling) #Circ: desired #Late PNC: SW consult #Sickle cell anemia: baseline hgb ~10, not currently in pain crisis. Repeat CBC at 2352 on 2/24 with stable H&H of 10.7 & 28.5 respectively.  3/24, MD 4:30 AM

## 2020-04-21 ENCOUNTER — Inpatient Hospital Stay (HOSPITAL_COMMUNITY): Payer: Medicaid Other | Admitting: Anesthesiology

## 2020-04-21 ENCOUNTER — Encounter (HOSPITAL_COMMUNITY)
Admission: AD | Disposition: A | Discharge status: Home / Self Care | Payer: Self-pay | Source: Home / Self Care | Attending: Obstetrics & Gynecology

## 2020-04-21 ENCOUNTER — Encounter (HOSPITAL_COMMUNITY): Payer: Self-pay | Admitting: Obstetrics and Gynecology

## 2020-04-21 DIAGNOSIS — O9902 Anemia complicating childbirth: Secondary | ICD-10-CM

## 2020-04-21 DIAGNOSIS — O99820 Streptococcus B carrier state complicating pregnancy: Secondary | ICD-10-CM

## 2020-04-21 DIAGNOSIS — O368330 Maternal care for abnormalities of the fetal heart rate or rhythm, third trimester, not applicable or unspecified: Secondary | ICD-10-CM

## 2020-04-21 DIAGNOSIS — O0933 Supervision of pregnancy with insufficient antenatal care, third trimester: Secondary | ICD-10-CM

## 2020-04-21 DIAGNOSIS — D571 Sickle-cell disease without crisis: Secondary | ICD-10-CM

## 2020-04-21 DIAGNOSIS — Z3A39 39 weeks gestation of pregnancy: Secondary | ICD-10-CM

## 2020-04-21 LAB — PROTEIN / CREATININE RATIO, URINE
Creatinine, Urine: 52.97 mg/dL
Protein Creatinine Ratio: 0.32 mg/mg{Cre} — ABNORMAL HIGH (ref 0.00–0.15)
Total Protein, Urine: 17 mg/dL

## 2020-04-21 LAB — COMPREHENSIVE METABOLIC PANEL
ALT: 16 U/L (ref 0–44)
AST: 32 U/L (ref 15–41)
Albumin: 2.6 g/dL — ABNORMAL LOW (ref 3.5–5.0)
Alkaline Phosphatase: 165 U/L — ABNORMAL HIGH (ref 38–126)
Anion gap: 10 (ref 5–15)
BUN: 5 mg/dL — ABNORMAL LOW (ref 6–20)
CO2: 21 mmol/L — ABNORMAL LOW (ref 22–32)
Calcium: 8.8 mg/dL — ABNORMAL LOW (ref 8.9–10.3)
Chloride: 107 mmol/L (ref 98–111)
Creatinine, Ser: 0.82 mg/dL (ref 0.44–1.00)
GFR, Estimated: >60 mL/min (ref >60)
Glucose, Bld: 104 mg/dL — ABNORMAL HIGH (ref 70–99)
Potassium: 3.7 mmol/L (ref 3.5–5.1)
Sodium: 138 mmol/L (ref 135–145)
Total Bilirubin: 1.2 mg/dL (ref 0.3–1.2)
Total Protein: 6.1 g/dL — ABNORMAL LOW (ref 6.5–8.1)

## 2020-04-21 LAB — CBC
HCT: 28.3 % — ABNORMAL LOW (ref 36.0–46.0)
Hemoglobin: 10.4 g/dL — ABNORMAL LOW (ref 12.0–15.0)
MCH: 32 pg (ref 26.0–34.0)
MCHC: 36.7 g/dL — ABNORMAL HIGH (ref 30.0–36.0)
MCV: 87.1 fL (ref 80.0–100.0)
Platelets: 292 10*3/uL (ref 150–400)
RBC: 3.25 MIL/uL — ABNORMAL LOW (ref 3.87–5.11)
RDW: 14.5 % (ref 11.5–15.5)
WBC: 18.5 10*3/uL — ABNORMAL HIGH (ref 4.0–10.5)
nRBC: 2.7 % — ABNORMAL HIGH (ref 0.0–0.2)

## 2020-04-21 SURGERY — Surgical Case
Anesthesia: Epidural | Wound class: Clean Contaminated

## 2020-04-21 MED ORDER — MORPHINE SULFATE (PF) 0.5 MG/ML IJ SOLN
INTRAMUSCULAR | Status: AC
  Filled 2020-04-21: qty 10

## 2020-04-21 MED ORDER — TERBUTALINE SULFATE 1 MG/ML IJ SOLN: 0.2500 mg | Freq: Once | INTRAMUSCULAR | Status: DC | PRN

## 2020-04-21 MED ORDER — NALBUPHINE HCL 10 MG/ML IJ SOLN: 5.0000 mg | Freq: Once | INTRAMUSCULAR | Status: DC | PRN

## 2020-04-21 MED ORDER — NALBUPHINE HCL 10 MG/ML IJ SOLN: 5.0000 mg | INTRAMUSCULAR | Status: DC | PRN

## 2020-04-21 MED ORDER — SODIUM CHLORIDE 0.9% FLUSH: 3.0000 mL | INTRAVENOUS | Status: DC | PRN

## 2020-04-21 MED ORDER — FENTANYL CITRATE (PF) 100 MCG/2ML IJ SOLN
INTRAMUSCULAR | Status: AC
  Filled 2020-04-21: qty 2

## 2020-04-21 MED ORDER — ONDANSETRON HCL 4 MG/2ML IJ SOLN
INTRAMUSCULAR | Status: AC
  Filled 2020-04-21: qty 2

## 2020-04-21 MED ORDER — SENNOSIDES-DOCUSATE SODIUM 8.6-50 MG PO TABS
2.0000 | ORAL_TABLET | Freq: Every day | ORAL | Status: DC
  Administered 2020-04-22 – 2020-04-26 (×4): 2 via ORAL
  Filled 2020-04-21 (×4): qty 2

## 2020-04-21 MED ORDER — ACETAMINOPHEN 500 MG PO TABS
1000.0000 mg | ORAL_TABLET | Freq: Four times a day (QID) | ORAL | Status: AC
  Administered 2020-04-21 – 2020-04-22 (×4): 1000 mg via ORAL
  Filled 2020-04-21 (×4): qty 2

## 2020-04-21 MED ORDER — ACETAMINOPHEN 10 MG/ML IV SOLN
INTRAVENOUS | Status: AC
  Filled 2020-04-21: qty 100

## 2020-04-21 MED ORDER — FENTANYL-BUPIVACAINE-NACL 0.5-0.125-0.9 MG/250ML-% EP SOLN
EPIDURAL | Status: DC | PRN
  Administered 2020-04-21: 12 mL/h via EPIDURAL

## 2020-04-21 MED ORDER — LIDOCAINE HCL (PF) 1 % IJ SOLN
INTRAMUSCULAR | Status: DC | PRN
  Administered 2020-04-21: 3 mL via EPIDURAL
  Administered 2020-04-21: 2 mL via EPIDURAL
  Administered 2020-04-21: 5 mL via EPIDURAL

## 2020-04-21 MED ORDER — MEPERIDINE HCL 25 MG/ML IJ SOLN: 6.2500 mg | INTRAMUSCULAR | Status: DC | PRN

## 2020-04-21 MED ORDER — COCONUT OIL OIL: 1.0000 "application " | TOPICAL_OIL | Status: DC | PRN

## 2020-04-21 MED ORDER — SIMETHICONE 80 MG PO CHEW: 80.0000 mg | CHEWABLE_TABLET | ORAL | Status: DC | PRN

## 2020-04-21 MED ORDER — NALOXONE HCL 0.4 MG/ML IJ SOLN: 0.4000 mg | INTRAMUSCULAR | Status: DC | PRN

## 2020-04-21 MED ORDER — OXYTOCIN-SODIUM CHLORIDE 30-0.9 UT/500ML-% IV SOLN
INTRAVENOUS | Status: DC | PRN
  Administered 2020-04-21: 30 [IU] via INTRAVENOUS

## 2020-04-21 MED ORDER — MENTHOL 3 MG MT LOZG: 1.0000 | LOZENGE | OROMUCOSAL | Status: DC | PRN

## 2020-04-21 MED ORDER — ACETAMINOPHEN 10 MG/ML IV SOLN
INTRAVENOUS | Status: DC | PRN
  Administered 2020-04-21: 1000 mg via INTRAVENOUS

## 2020-04-21 MED ORDER — SODIUM CHLORIDE 0.9 % IV SOLN
INTRAVENOUS | Status: AC
  Filled 2020-04-21: qty 500

## 2020-04-21 MED ORDER — KETOROLAC TROMETHAMINE 30 MG/ML IJ SOLN: 30.0000 mg | Freq: Four times a day (QID) | INTRAMUSCULAR | Status: AC | PRN

## 2020-04-21 MED ORDER — WITCH HAZEL-GLYCERIN EX PADS
1.0000 | MEDICATED_PAD | CUTANEOUS | Status: DC | PRN
Start: 2020-04-21 — End: 2020-04-26

## 2020-04-21 MED ORDER — LACTATED RINGERS IV SOLN: INTRAVENOUS | Status: DC

## 2020-04-21 MED ORDER — OXYTOCIN-SODIUM CHLORIDE 30-0.9 UT/500ML-% IV SOLN
INTRAVENOUS | Status: AC
  Filled 2020-04-21: qty 500

## 2020-04-21 MED ORDER — CEFAZOLIN SODIUM-DEXTROSE 2-4 GM/100ML-% IV SOLN
INTRAVENOUS | Status: AC
  Filled 2020-04-21: qty 100

## 2020-04-21 MED ORDER — MEPERIDINE HCL 25 MG/ML IJ SOLN
INTRAMUSCULAR | Status: DC | PRN
  Administered 2020-04-21: 12.5 mg via INTRAVENOUS

## 2020-04-21 MED ORDER — ONDANSETRON HCL 4 MG/2ML IJ SOLN: 4.0000 mg | Freq: Three times a day (TID) | INTRAMUSCULAR | Status: DC | PRN

## 2020-04-21 MED ORDER — LIDOCAINE-EPINEPHRINE (PF) 2 %-1:200000 IJ SOLN
INTRAMUSCULAR | Status: DC | PRN
  Administered 2020-04-21: 5 mL via INTRADERMAL

## 2020-04-21 MED ORDER — FENTANYL-BUPIVACAINE-NACL 0.5-0.125-0.9 MG/250ML-% EP SOLN
EPIDURAL | Status: AC
  Filled 2020-04-21: qty 250

## 2020-04-21 MED ORDER — SCOPOLAMINE 1 MG/3DAYS TD PT72: 1.0000 | MEDICATED_PATCH | Freq: Once | TRANSDERMAL | Status: DC

## 2020-04-21 MED ORDER — MORPHINE SULFATE (PF) 0.5 MG/ML IJ SOLN
INTRAMUSCULAR | Status: DC | PRN
  Administered 2020-04-21: 3 mg via EPIDURAL

## 2020-04-21 MED ORDER — CEFAZOLIN SODIUM-DEXTROSE 2-4 GM/100ML-% IV SOLN
2.0000 g | Freq: Once | INTRAVENOUS | Status: AC
  Administered 2020-04-21: 2 g via INTRAVENOUS

## 2020-04-21 MED ORDER — MEPERIDINE HCL 25 MG/ML IJ SOLN
INTRAMUSCULAR | Status: AC
  Filled 2020-04-21: qty 1

## 2020-04-21 MED ORDER — NALOXONE HCL 4 MG/10ML IJ SOLN
1.0000 ug/kg/h | INTRAMUSCULAR | Status: DC | PRN
Start: 2020-04-21 — End: 2020-04-23
  Filled 2020-04-21: qty 5

## 2020-04-21 MED ORDER — NALBUPHINE HCL 10 MG/ML IJ SOLN
5.0000 mg | Freq: Once | INTRAMUSCULAR | Status: DC | PRN
Start: 2020-04-21 — End: 2020-04-23

## 2020-04-21 MED ORDER — OXYTOCIN-SODIUM CHLORIDE 30-0.9 UT/500ML-% IV SOLN: 2.5000 [IU]/h | INTRAVENOUS | Status: AC

## 2020-04-21 MED ORDER — SODIUM CHLORIDE 0.9 % IV SOLN
500.0000 mg | INTRAVENOUS | Status: AC
  Administered 2020-04-21: 500 mg via INTRAVENOUS

## 2020-04-21 MED ORDER — SCOPOLAMINE 1 MG/3DAYS TD PT72
MEDICATED_PATCH | TRANSDERMAL | Status: AC
  Filled 2020-04-21: qty 1

## 2020-04-21 MED ORDER — OXYTOCIN-SODIUM CHLORIDE 30-0.9 UT/500ML-% IV SOLN
1.0000 m[IU]/min | INTRAVENOUS | Status: DC
  Administered 2020-04-21: 2 m[IU]/min via INTRAVENOUS

## 2020-04-21 MED ORDER — SCOPOLAMINE 1 MG/3DAYS TD PT72
MEDICATED_PATCH | TRANSDERMAL | Status: DC | PRN
  Administered 2020-04-21: 1 via TRANSDERMAL

## 2020-04-21 MED ORDER — ONDANSETRON HCL 4 MG/2ML IJ SOLN
INTRAMUSCULAR | Status: DC | PRN
  Administered 2020-04-21: 4 mg via INTRAVENOUS

## 2020-04-21 MED ORDER — LACTATED RINGERS AMNIOINFUSION: INTRAVENOUS | Status: DC

## 2020-04-21 MED ORDER — ENOXAPARIN SODIUM 40 MG/0.4ML ~~LOC~~ SOLN
40.0000 mg | SUBCUTANEOUS | Status: DC
  Administered 2020-04-22 – 2020-04-23 (×2): 40 mg via SUBCUTANEOUS
  Filled 2020-04-21 (×2): qty 0.4

## 2020-04-21 MED ORDER — DIPHENHYDRAMINE HCL 25 MG PO CAPS
25.0000 mg | ORAL_CAPSULE | ORAL | Status: DC | PRN
  Administered 2020-04-21: 25 mg via ORAL

## 2020-04-21 MED ORDER — LIDOCAINE-EPINEPHRINE (PF) 2 %-1:200000 IJ SOLN
INTRAMUSCULAR | Status: DC | PRN
  Administered 2020-04-21 (×2): 5 mL via EPIDURAL

## 2020-04-21 MED ORDER — FENTANYL CITRATE (PF) 100 MCG/2ML IJ SOLN
INTRAMUSCULAR | Status: DC | PRN
  Administered 2020-04-21: 50 ug via INTRAVENOUS

## 2020-04-21 MED ORDER — NALBUPHINE HCL 10 MG/ML IJ SOLN
5.0000 mg | INTRAMUSCULAR | Status: DC | PRN
Start: 2020-04-21 — End: 2020-04-23

## 2020-04-21 MED ORDER — FENTANYL CITRATE (PF) 100 MCG/2ML IJ SOLN
INTRAMUSCULAR | Status: DC | PRN
  Administered 2020-04-21: 100 ug via EPIDURAL

## 2020-04-21 MED ORDER — TETANUS-DIPHTH-ACELL PERTUSSIS 5-2.5-18.5 LF-MCG/0.5 IM SUSY: 0.5000 mL | PREFILLED_SYRINGE | Freq: Once | INTRAMUSCULAR | Status: DC

## 2020-04-21 MED ORDER — IBUPROFEN 800 MG PO TABS
800.0000 mg | ORAL_TABLET | Freq: Three times a day (TID) | ORAL | Status: DC
  Administered 2020-04-21 – 2020-04-22 (×5): 800 mg via ORAL
  Filled 2020-04-21 (×4): qty 1

## 2020-04-21 MED ORDER — PHENYLEPHRINE 40 MCG/ML (10ML) SYRINGE FOR IV PUSH (FOR BLOOD PRESSURE SUPPORT)
PREFILLED_SYRINGE | INTRAVENOUS | Status: AC
  Filled 2020-04-21: qty 10

## 2020-04-21 MED ORDER — PRENATAL MULTIVITAMIN CH
1.0000 | ORAL_TABLET | Freq: Every day | ORAL | Status: DC
  Administered 2020-04-23 – 2020-04-25 (×2): 1 via ORAL
  Filled 2020-04-21 (×2): qty 1

## 2020-04-21 MED ORDER — GABAPENTIN 100 MG PO CAPS
100.0000 mg | ORAL_CAPSULE | Freq: Three times a day (TID) | ORAL | Status: DC
  Administered 2020-04-21 – 2020-04-23 (×5): 100 mg via ORAL
  Filled 2020-04-21 (×5): qty 1

## 2020-04-21 MED ORDER — DIPHENHYDRAMINE HCL 50 MG/ML IJ SOLN
12.5000 mg | INTRAMUSCULAR | Status: DC | PRN
Start: 2020-04-21 — End: 2020-04-25

## 2020-04-21 MED ORDER — SOD CITRATE-CITRIC ACID 500-334 MG/5ML PO SOLN
30.0000 mL | ORAL | Status: AC
  Administered 2020-04-21: 30 mL via ORAL

## 2020-04-21 MED ORDER — SIMETHICONE 80 MG PO CHEW
80.0000 mg | CHEWABLE_TABLET | Freq: Three times a day (TID) | ORAL | Status: DC
  Administered 2020-04-22 – 2020-04-23 (×4): 80 mg via ORAL
  Filled 2020-04-21 (×4): qty 1

## 2020-04-21 MED ORDER — DIPHENHYDRAMINE HCL 25 MG PO CAPS
25.0000 mg | ORAL_CAPSULE | Freq: Four times a day (QID) | ORAL | Status: DC | PRN
  Filled 2020-04-21: qty 1

## 2020-04-21 MED ORDER — LACTATED RINGERS IV SOLN: 500.0000 mL | Freq: Once | INTRAVENOUS | Status: DC

## 2020-04-21 MED ORDER — DIBUCAINE (PERIANAL) 1 % EX OINT
1.0000 | TOPICAL_OINTMENT | CUTANEOUS | Status: DC | PRN
Start: 2020-04-21 — End: 2020-04-26

## 2020-04-21 MED ORDER — SODIUM CHLORIDE 0.9 % IV SOLN: INTRAVENOUS | Status: DC | PRN

## 2020-04-21 SURGICAL SUPPLY — 28 items
CHLORAPREP W/TINT 26ML (MISCELLANEOUS) ×3 IMPLANT
CLAMP CORD UMBIL (MISCELLANEOUS) IMPLANT
DRSG OPSITE POSTOP 4X10 (GAUZE/BANDAGES/DRESSINGS) ×3 IMPLANT
ELECT REM PT RETURN 9FT ADLT (ELECTROSURGICAL) ×3
ELECTRODE REM PT RTRN 9FT ADLT (ELECTROSURGICAL) ×1 IMPLANT
EXTRACTOR VACUUM M CUP 4 TUBE (SUCTIONS) IMPLANT
EXTRACTOR VACUUM M CUP 4' TUBE (SUCTIONS)
GAUZE XEROFORM 4X4 STRL (GAUZE/BANDAGES/DRESSINGS) ×6 IMPLANT
GLOVE BIOGEL PI IND STRL 6.5 (GLOVE) ×1 IMPLANT
GLOVE BIOGEL PI IND STRL 7.0 (GLOVE) ×1 IMPLANT
GLOVE BIOGEL PI INDICATOR 6.5 (GLOVE) ×2
GLOVE BIOGEL PI INDICATOR 7.0 (GLOVE) ×2
GLOVE SURG SS PI 6.5 STRL IVOR (GLOVE) ×3 IMPLANT
GOWN STRL REUS W/TWL LRG LVL3 (GOWN DISPOSABLE) ×6 IMPLANT
KIT ABG SYR 3ML LUER SLIP (SYRINGE) IMPLANT
NEEDLE HYPO 25X5/8 SAFETYGLIDE (NEEDLE) IMPLANT
NS IRRIG 1000ML POUR BTL (IV SOLUTION) ×3 IMPLANT
PACK C SECTION WH (CUSTOM PROCEDURE TRAY) ×3 IMPLANT
PAD ABD 7.5X8 STRL (GAUZE/BANDAGES/DRESSINGS) ×3 IMPLANT
PAD OB MATERNITY 4.3X12.25 (PERSONAL CARE ITEMS) ×3 IMPLANT
PENCIL SMOKE EVAC W/HOLSTER (ELECTROSURGICAL) ×3 IMPLANT
RTRCTR C-SECT PINK 25CM LRG (MISCELLANEOUS) IMPLANT
SUT PLAIN 0 NONE (SUTURE) IMPLANT
SUT VIC AB 0 CT1 36 (SUTURE) ×12 IMPLANT
SUT VIC AB 4-0 KS 27 (SUTURE) ×3 IMPLANT
TOWEL OR 17X24 6PK STRL BLUE (TOWEL DISPOSABLE) ×3 IMPLANT
TRAY FOLEY W/BAG SLVR 14FR LF (SET/KITS/TRAYS/PACK) ×3 IMPLANT
WATER STERILE IRR 1000ML POUR (IV SOLUTION) ×3 IMPLANT

## 2020-04-21 NOTE — Progress Notes (Signed)
LABOR PROGRESS NOTE  Amanda Burch is a 20 y.o. G1P0000 at [redacted]w[redacted]d admitted for IOL due to sickle cell disease.  Subjective: Patient recently developed worsening contraction pain and nausea with 1 episode of vomiting. Feeling better after fentanyl .  Objective: BP (!) 120/58   Pulse 83   Temp 98.2 F (36.8 C) (Oral)   Resp 18   Ht 5\' 4"  (1.626 m)   Wt 96.6 kg   LMP 09/02/2019   SpO2 98%   BMI 36.54 kg/m  or  Vitals:   04/20/20 1804 04/20/20 1945 04/20/20 1949 04/21/20 0010  BP: 134/71  (!) 135/110 (!) 120/58  Pulse: 70  79 83  Resp: 16  17 18   Temp: 98.6 F (37 C) 98 F (36.7 C)  98.2 F (36.8 C)  TempSrc: Oral Oral  Oral  SpO2:      Weight:      Height:        Dilation: 1 Effacement (%): 60 Cervical Position: Posterior Station: Ballotable Presentation: Vertex Exam by:: , CNM FHT: baseline rate 150, moderate varibility, occasional acel, intermittent late decels Toco: q4-90min  Labs: Lab Results  Component Value Date   WBC 11.5 (H) 04/19/2020   HGB 10.7 (L) 04/19/2020   HCT 28.5 (L) 04/19/2020   MCV 86.1 04/19/2020   PLT 302 04/19/2020    Patient Active Problem List   Diagnosis Date Noted  . Indication for care in labor or delivery 04/19/2020  . Supervision of high risk pregnancy, antepartum 02/08/2020  . Sickle cell anemia (HCC)   . Late prenatal care complicating pregnancy   . Chlamydia infection affecting pregnancy in second trimester 02/02/2020  . GBS bacteriuria 01/30/2020  . 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 G1P0000 at [redacted]w[redacted]d here for IOL due to sickle cell disease.  IOL: s/p Cytotec x4 on 2/24 and x1 (2/25 at 2046). FB #2 placed 2/25 at 1800, remains in place. Will plan to initiate pitocin as long as strip remains Cat I without decels. Fetal Wellbeing:  Had been cat II due to recurrent lates--overall reassuring and has since improved to Cat I. Will continue to monitor closely. Sickle Cell Disease:  well-controlled with baseline Hgb ~10.5. Not on chronic narcotics. Had 1 pain crisis during pregnancy (Jan 2022) which required ED visit and narcotics, but no hospital admission. Prior to that, last crisis was several years ago. GBS Positive: Adequately treated Late PNC: SW consult Pain Control:  Prn per patient request Anticipated MOD:  Vaginal   3/25, MD PGY-1 Family Medicine Resident 04/21/2020, 12:58 AM

## 2020-04-21 NOTE — Discharge Instructions (Signed)

## 2020-04-21 NOTE — Progress Notes (Signed)
LABOR PROGRESS NOTE  Amanda Burch is a 20 y.o. G1P0000 at [redacted]w[redacted]d admitted for IOL due to sickle cell disease (crisis 02/2020).  Subjective: Patient continues to have intermittent episodes of nausea/vomiting. Does not feel the Zofran is helping significantly. Currently patient is resting comfortably in bed. She does not desire epidural.  Objective: BP 131/72   Pulse 77   Temp 98.4 F (36.9 C) (Oral)   Resp 20   Ht 5\' 4"  (1.626 m)   Wt 96.6 kg   LMP 09/02/2019   SpO2 98%   BMI 36.54 kg/m  or  Vitals:   04/21/20 0120 04/21/20 0203 04/21/20 0308 04/21/20 0432  BP: 106/79 133/78 (!) 141/80 131/72  Pulse: 88 78 75 77  Resp: 19 19 19 20   Temp:   98.4 F (36.9 C)   TempSrc:   Oral   SpO2:      Weight:      Height:        Dilation: 6 Effacement (%): 50 Cervical Position: Posterior Station: -2 Presentation: Vertex Exam by:: MD FHT: baseline rate 150, moderate varibility, neg acel, recurrent late and variable decels with contractions Toco: intermittent  Labs: Lab Results  Component Value Date   WBC 18.5 (H) 04/21/2020   HGB 10.4 (L) 04/21/2020   HCT 28.3 (L) 04/21/2020   MCV 87.1 04/21/2020   PLT 292 04/21/2020    Patient Active Problem List   Diagnosis Date Noted  . Indication for care in labor or delivery 04/19/2020  . Supervision of high risk pregnancy, antepartum 02/08/2020  . Sickle cell anemia (HCC)   . Late prenatal care complicating pregnancy   . Chlamydia infection affecting pregnancy in second trimester 02/02/2020  . GBS bacteriuria 01/30/2020  . 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 G1P0000 at [redacted]w[redacted]d here for IOL due to sickle cell disease.  #IOL:  -s/p Cytotec x5 (most recent 2/25 at 2046) -s/p FB x2 (out 2/26 at 0235) -Pit initiated at 0200, stopped at 0405 due to recurrent lates/category II strip -AROM with clear fluid @0530  and IUPC/FSE placed -Discussed with Dr. 2047 given cat II  Fetal Wellbeing:  Cat II due  to recurrent lates Pain Control:  IV pain meds prn, does not desire epidural, discussed extensively benefits of epidural in her case given pain with cervical checks and tightness in her pelvis #Elevated BP: BP elevated to 141/80. No HA/vision changes. preE labs pending. #Sickle Cell Disease: well-controlled with baseline Hgb ~10.5. Not on chronic narcotics. Had 1 pain crisis during pregnancy (Jan 2022) which required ED visit and narcotics, but no hospital admission. Prior to that, last crisis was several years ago. #GBS Positive: Adequately treated #Late PNC: SW consult  , MD OB Fellow, Faculty Practice [facility], Center for [facility] 04/21/2020 5:53 AM

## 2020-04-21 NOTE — Progress Notes (Signed)
Call Maury Dus to get an order for LR for the patient. Verbal order was given 125 ml per hour.

## 2020-04-21 NOTE — Anesthesia Procedure Notes (Signed)
Epidural Patient location during procedure: OB Start time: 04/21/2020 7:14 AM End time: 04/21/2020 7:24 AM  Staffing Anesthesiologist: Cecile Hearing, MD Performed: anesthesiologist   Preanesthetic Checklist Completed: patient identified, IV checked, risks and benefits discussed, monitors and equipment checked, pre-op evaluation and timeout performed  Epidural Patient position: sitting Prep: DuraPrep Patient monitoring: blood pressure and continuous pulse ox Approach: midline Location: L3-L4 Injection technique: LOR air  Needle:  Needle type: Tuohy  Needle gauge: 17 G Needle length: 9 cm Needle insertion depth: 6 cm Catheter size: 19 Gauge Catheter at skin depth: 11 cm Test dose: negative and Other (1% Lidocaine)  Additional Notes Patient identified.  Risk benefits discussed including failed block, incomplete pain control, headache, nerve damage, paralysis, blood pressure changes, nausea, vomiting, reactions to medication both toxic or allergic, and postpartum back pain.  Patient expressed understanding and wished to proceed.  All questions were answered.  Sterile technique used throughout procedure and epidural site dressed with sterile barrier dressing. No paresthesia or other complications noted. The patient did not experience any signs of intravascular injection such as tinnitus or metallic taste in mouth nor signs of intrathecal spread such as rapid motor block. Please see nursing notes for vital signs. Reason for block:procedure for pain

## 2020-04-21 NOTE — Anesthesia Postprocedure Evaluation (Signed)
Anesthesia Post Note  Patient: Amanda Burch  Procedure(s) Performed: CESAREAN SECTION (N/A )     Patient location during evaluation: PACU Anesthesia Type: Epidural Level of consciousness: oriented, awake and alert and awake Pain management: pain level controlled Vital Signs Assessment: post-procedure vital signs reviewed and stable Respiratory status: spontaneous breathing, respiratory function stable and patient connected to nasal cannula oxygen Cardiovascular status: blood pressure returned to baseline and stable Postop Assessment: no headache, no backache, no apparent nausea or vomiting and epidural receding Anesthetic complications: no   No complications documented.  Last Vitals:  Vitals:   04/21/20 1303 04/21/20 1335  BP:  126/71  Pulse: 61 68  Resp: 11 18  Temp:  36.7 C  SpO2: 98% 98%    Last Pain:  Vitals:   04/21/20 1335  TempSrc:   PainSc: 4    Pain Goal: Patients Stated Pain Goal: 1 (04/20/20 2129)              Epidural/Spinal Function Cutaneous sensation: Normal sensation (04/21/20 1335), Patient able to flex knees: Yes (04/21/20 1335), Patient able to lift hips off bed: Yes (04/21/20 1335), Back pain beyond tenderness at insertion site: No (04/21/20 1335), Progressively worsening motor and/or sensory loss: No (04/21/20 1335), Bowel and/or bladder incontinence post epidural: No (04/21/20 1335)  Cecile Hearing

## 2020-04-21 NOTE — Anesthesia Preprocedure Evaluation (Addendum)
Anesthesia Evaluation  Patient identified by MRN, date of birth, ID band Patient awake    Reviewed: Allergy & Precautions, NPO status , Patient's Chart, lab work & pertinent test results  Airway Mallampati: II  TM Distance: >3 FB Neck ROM: Full    Dental  (+) Teeth Intact, Dental Advisory Given   Pulmonary neg pulmonary ROS,    Pulmonary exam normal breath sounds clear to auscultation       Cardiovascular negative cardio ROS Normal cardiovascular exam Rhythm:Regular Rate:Normal     Neuro/Psych negative neurological ROS     GI/Hepatic negative GI ROS, Neg liver ROS,   Endo/Other  Obesity   Renal/GU negative Renal ROS     Musculoskeletal negative musculoskeletal ROS (+)   Abdominal   Peds  Hematology  (+) Blood dyscrasia, Sickle cell anemia and anemia , Plt 292k   Anesthesia Other Findings Day of surgery medications reviewed with the patient.  Reproductive/Obstetrics                             Anesthesia Physical Anesthesia Plan  ASA: II and emergent  Anesthesia Plan: Epidural   Post-op Pain Management:    Induction:   PONV Risk Score and Plan: 2 and Treatment may vary due to age or medical condition  Airway Management Planned: Natural Airway  Additional Equipment:   Intra-op Plan:   Post-operative Plan:   Informed Consent: I have reviewed the patients History and Physical, chart, labs and discussed the procedure including the risks, benefits and alternatives for the proposed anesthesia with the patient or authorized representative who has indicated his/her understanding and acceptance.     Dental advisory given  Plan Discussed with:   Anesthesia Plan Comments: (Epidural to C-section for fetal intolerance of labor.  Patient identified. Risks/Benefits/Options discussed with patient including but not limited to bleeding, infection, nerve damage, paralysis, failed block,  incomplete pain control, headache, blood pressure changes, nausea, vomiting, reactions to medication both or allergic, itching and postpartum back pain. Confirmed with bedside nurse the patient's most recent platelet count. Confirmed with patient that they are not currently taking any anticoagulation, have any bleeding history or any family history of bleeding disorders. Patient expressed understanding and wished to proceed. All questions were answered. )       Anesthesia Quick Evaluation

## 2020-04-21 NOTE — Progress Notes (Signed)
LABOR PROGRESS NOTE  Amanda Burch is a 20 y.o. G1P0000 at [redacted]w[redacted]d admitted for IOL due to sickle cell disease.  Subjective: Patient continues to have intermittent episodes of nausea/vomiting. Does not feel the Zofran is helping significantly. Currently patient is resting comfortably in bed. She does not desire epidural.  Objective: BP 131/72   Pulse 77   Temp 98.4 F (36.9 C) (Oral)   Resp 20   Ht 5\' 4"  (1.626 m)   Wt 96.6 kg   LMP 09/02/2019   SpO2 98%   BMI 36.54 kg/m  or  Vitals:   04/21/20 0120 04/21/20 0203 04/21/20 0308 04/21/20 0432  BP: 106/79 133/78 (!) 141/80 131/72  Pulse: 88 78 75 77  Resp: 19 19 19 20   Temp:   98.4 F (36.9 C)   TempSrc:   Oral   SpO2:      Weight:      Height:        Dilation: 5.5 Effacement (%): 50 Cervical Position: Posterior Station: -2 Presentation: Vertex Exam by:: FHT: baseline rate 150, moderate varibility, neg acel, recurrent late decels Toco: q3-7 mins  Labs: Lab Results  Component Value Date   WBC 11.5 (H) 04/19/2020   HGB 10.7 (L) 04/19/2020   HCT 28.5 (L) 04/19/2020   MCV 86.1 04/19/2020   PLT 302 04/19/2020    Patient Active Problem List   Diagnosis Date Noted  . Indication for care in labor or delivery 04/19/2020  . Supervision of high risk pregnancy, antepartum 02/08/2020  . Sickle cell anemia (HCC)   . Late prenatal care complicating pregnancy   . Chlamydia infection affecting pregnancy in second trimester 02/02/2020  . GBS bacteriuria 01/30/2020  . 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 G1P0000 at [redacted]w[redacted]d here for IOL due to sickle cell disease.  #IOL:  -s/p Cytotec x5 (most recent 2/25 at 2046) -s/p FB x2 (out 2/26 at 0235) -Pit initiated at 0200, stopped at 0405 due to recurrent lates/category II strip -Continue to monitor closely, position changes/fluid boluses as appropriate -Has progressed to 5.5/50/-2 from 1/60/ballotable. Will consider AROM at next check. Fetal  Wellbeing:  Cat II due to recurrent lates Pain Control:  IV pain meds prn, does not desire epidural #Elevated BP: BP elevated to 141/80. No HA/vision changes. Will check preE labs. #Sickle Cell Disease: well-controlled with baseline Hgb ~10.5. Not on chronic narcotics. Had 1 pain crisis during pregnancy (Jan 2022) which required ED visit and narcotics, but no hospital admission. Prior to that, last crisis was several years ago. #GBS Positive: Adequately treated #Late PNC: SW consult  05-11-1983, MD PGY-1 Family Medicine Resident 04/21/2020, 4:48 AM

## 2020-04-21 NOTE — Transfer of Care (Signed)
Immediate Anesthesia Transfer of Care Note  Patient: Amanda Burch  Procedure(s) Performed: CESAREAN SECTION (N/A )  Patient Location: PACU  Anesthesia Type:Epidural  Level of Consciousness: awake, alert  and oriented  Airway & Oxygen Therapy: Patient Spontanous Breathing  Post-op Assessment: Report given to RN and Post -op Vital signs reviewed and stable  Post vital signs: Reviewed and stable  Last Vitals:  Vitals Value Taken Time  BP 119/52 04/21/20 1153  Temp    Pulse 102 04/21/20 1155  Resp 18 04/21/20 1155  SpO2 100 % 04/21/20 1155  Vitals shown include unvalidated device data.  Last Pain:  Vitals:   04/21/20 0931  TempSrc: Oral  PainSc: 4       Patients Stated Pain Goal: 1 (04/20/20 2129)  Complications: No complications documented.

## 2020-04-21 NOTE — Op Note (Signed)
Amanda Burch PROCEDURE DATE: 04/19/2020 - 04/21/2020  PREOPERATIVE DIAGNOSIS: Intrauterine pregnancy at  [redacted]w[redacted]d weeks gestation; non-reassuring fetal status  POSTOPERATIVE DIAGNOSIS: The same  PROCEDURE:     Cesarean Section  SURGEON:  Dr. Catalina Antigua  ASSISTANT: Dr. Barb Merino  INDICATIONS: Amanda Burch is a 20 y.o. G1P0000 at [redacted]w[redacted]d scheduled for cesarean section secondary to non-reassuring fetal status.  The risks of cesarean section discussed with the patient included but were not limited to: bleeding which may require transfusion or reoperation; infection which may require antibiotics; injury to bowel, bladder, ureters or other surrounding organs; injury to the fetus; need for additional procedures including hysterectomy in the event of a life-threatening hemorrhage; placental abnormalities wth subsequent pregnancies, incisional problems, thromboembolic phenomenon and other postoperative/anesthesia complications. The patient concurred with the proposed plan, giving informed written consent for the procedure.    FINDINGS:  Viable female infant in cephalic presentation.  Apgars 7 and 8.  Clear amniotic fluid.  Intact placenta, three vessel cord.  Normal uterus, fallopian tubes and ovaries bilaterally.  ANESTHESIA:    Spinal INTRAVENOUS FLUIDS:1600 ml ESTIMATED BLOOD LOSS: 155 ml URINE OUTPUT:  150 ml SPECIMENS: Placenta sent to L&D COMPLICATIONS: None immediate  PROCEDURE IN DETAIL:  The patient received intravenous antibiotics and had sequential compression devices applied to her lower extremities while in the preoperative area.  She was then taken to the operating room where anesthesia was induced and was found to be adequate. A foley catheter was placed into her bladder and attached to Amanda Burch gravity. She was then placed in a dorsal supine position with a leftward tilt, and prepped and draped in a sterile manner. After an adequate timeout was performed, a Pfannenstiel skin incision was  made with scalpel and carried through to the underlying layer of fascia. The fascia was incised in the midline and this incision was extended bilaterally using the Mayo scissors. Kocher clamps were applied to the superior aspect of the fascial incision and the underlying rectus muscles were dissected off bluntly. A similar process was carried out on the inferior aspect of the facial incision. The rectus muscles were separated in the midline bluntly and the peritoneum was entered bluntly. The Alexis self-retaining retractor was introduced into the abdominal cavity. Attention was turned to the lower uterine segment where a transverse hysterotomy was made with a scalpel and extended bilaterally bluntly. The infant was successfully delivered delayed cord clamping was performed for 1 minute. The cord was clamped and cut and infant was handed over to awaiting neonatology team. Uterine massage was then administered and the placenta delivered intact with three-vessel cord. The uterus was cleared of clot and debris.  The hysterotomy was closed with 0 Vicryl in a running locked fashion, and an imbricating layer was also placed with a 0 Vicryl. Overall, excellent hemostasis was noted. The pelvis copiously irrigated and cleared of all clot and debris. Hemostasis was confirmed on all surfaces.  The peritoneum and the muscles were reapproximated using 0 vicryl interrupted stitches. The fascia was then closed using 0 Vicryl in a running fashion.  The skin was closed in a subcuticular fashion using 3.0 Vicryl. The patient tolerated the procedure well. Sponge, lap, instrument and needle counts were correct x 2. She was taken to the recovery room in stable condition.    Amanda Burch ConstantMD  04/21/2020 11:28 AM

## 2020-04-21 NOTE — Lactation Note (Signed)
This note was copied from a baby's chart. Lactation Consultation Note  Patient Name: Amanda Burch [id number] Date: 04/21/2020 Reason for consult: 1st time breastfeeding;Primapara;Initial assessment;Term;Other (Comment) (mom - failed induction - C/S) Age:20 hours  Per dad baby took 7 ml from the bottle .  Bay awake and rooting. LC offered to assist with latching and mom receptive.  Diaper dry, baby awake and rooting, latched for a few sucks and off. Latch score 6  LC reviewed breast feeding basics , mom hand expressed small drops.  Mom expressed she is very tired and LC recommended a nap while the baby is sleeping and to call with feeding cues.    LC provided the [facility] brochure with resource numbers.    Maternal Data Has patient been taught Hand Expression?: Yes Does the patient have breastfeeding experience prior to this delivery?: No  Feeding Mother's Current Feeding Choice: Breast Milk and Formula  LATCH Score Latch: Repeated attempts needed to sustain latch, nipple held in mouth throughout feeding, stimulation needed to elicit sucking reflex.  Audible Swallowing: None  Type of Nipple: Everted at rest and after stimulation  Comfort (Breast/Nipple): Soft / non-tender  Hold (Positioning): Assistance needed to correctly position infant at breast and maintain latch.  LATCH Score: 6   Lactation Tools Discussed/Used    Interventions Interventions: Breast feeding basics reviewed;Assisted with latch;Skin to skin;Breast massage;Hand express;Reverse pressure;Breast compression;Adjust position;Support pillows;Position options  Discharge    Consult Status Consult Status: Follow-up Date: 04/21/20 Follow-up type: In-patient    [name] 04/21/2020, 2:47 PM

## 2020-04-21 NOTE — Progress Notes (Signed)
LABOR PROGRESS NOTE  Amanda Burch is a 20 y.o. G1P0000 at [redacted]w[redacted]d admitted for IOL due to sickle cell disease (crisis 02/2020).  Subjective: No concerns per patient at this time. Pt understands recommendation for Cesarean at this time.  Objective: BP 124/68   Pulse 70   Temp 98.5 F (36.9 C) (Oral)   Resp 17   Ht 5\' 4"  (1.626 m)   Wt 96.6 kg   LMP 09/02/2019   SpO2 96%   BMI 36.54 kg/m  or  Vitals:   04/21/20 0746 04/21/20 0750 04/21/20 0753 04/21/20 0755  BP: 128/72  128/68 124/68  Pulse: 67  72 70  Resp: 18 18 18 17   Temp:      TempSrc:      SpO2:   96% 96%  Weight:      Height:        Dilation: 6 Effacement (%): 50 Cervical Position: Posterior Station: -2 Presentation: Vertex Exam by:: [place] MD FHT: baseline rate 145, moderate varibility, no accelerations, recurrent, deep late and variable decels with contractions Toco: intermittent  Labs: Lab Results  Component Value Date   WBC 18.5 (H) 04/21/2020   HGB 10.4 (L) 04/21/2020   HCT 28.3 (L) 04/21/2020   MCV 87.1 04/21/2020   PLT 292 04/21/2020    Patient Active Problem List   Diagnosis Date Noted  . Indication for care in labor or delivery 04/19/2020  . Supervision of high risk pregnancy, antepartum 02/08/2020  . Sickle cell anemia (HCC)   . Late prenatal care complicating pregnancy   . Chlamydia infection affecting pregnancy in second trimester 02/02/2020  . GBS bacteriuria 01/30/2020  . 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 G1P0000 at [redacted]w[redacted]d here for IOL due to sickle cell disease.  #IOL:  Pt with persistent Category 2 strip in addition to lack of cervical change for the past 6 hours. Pt understands the need for Cesarean at this time; she is consented for primary Cesarean given fetal intolerance and inability to restart pitocin to progress labor forward. The risks of cesarean section were discussed with the patient including but were not limited to: bleeding which may require  transfusion or reoperation; infection which may require antibiotics; injury to bowel, bladder, ureters or other surrounding organs; injury to the fetus; need for additional procedures including hysterectomy in the event of a life-threatening hemorrhage; placental abnormalities wth subsequent pregnancies, incisional problems, thromboembolic phenomenon and other postoperative/anesthesia complications. The patient concurred with the proposed plan, giving informed written consent for the procedures.  Patient has been NPO since last night; she will remain NPO for procedure. Anesthesia and OR aware.  Preoperative prophylactic antibiotics and SCDs ordered on call to the OR.  To OR when ready.   Fetal Wellbeing:  Cat II due to recurrent, deep late and variable decels Pain Control:  Epidural in place #Elevated BP: BP elevated to 141/80. No HA/vision changes. preE labs pending. #Sickle Cell Disease: well-controlled with baseline Hgb ~10.5. Not on chronic narcotics. Had 1 pain crisis during pregnancy (Jan 2022) which required ED visit and narcotics, but no hospital admission. Prior to that, last crisis was several years ago. #GBS Positive: Adequately treated; will order ancef and azithromycin prior to OR #Late PNC: SW consult  [redacted]w[redacted]d, MD OB Fellow, Faculty Practice [facility], Center [facility] 04/21/2020 9:17 AM

## 2020-04-22 LAB — CBC
HCT: 24.7 % — ABNORMAL LOW (ref 36.0–46.0)
Hemoglobin: 9 g/dL — ABNORMAL LOW (ref 12.0–15.0)
MCH: 31.9 pg (ref 26.0–34.0)
MCHC: 36.4 g/dL — ABNORMAL HIGH (ref 30.0–36.0)
MCV: 87.6 fL (ref 80.0–100.0)
Platelets: 233 10*3/uL (ref 150–400)
RBC: 2.82 MIL/uL — ABNORMAL LOW (ref 3.87–5.11)
RDW: 14.2 % (ref 11.5–15.5)
WBC: 19.4 10*3/uL — ABNORMAL HIGH (ref 4.0–10.5)
nRBC: 5.8 % — ABNORMAL HIGH (ref 0.0–0.2)

## 2020-04-22 LAB — CREATININE, SERUM
Creatinine, Ser: 0.76 mg/dL (ref 0.44–1.00)
GFR, Estimated: >60 mL/min (ref >60)

## 2020-04-22 MED ORDER — IBUPROFEN 800 MG PO TABS: 800.0000 mg | ORAL_TABLET | Freq: Three times a day (TID) | ORAL | Status: DC

## 2020-04-22 MED ORDER — SODIUM CHLORIDE 0.9 % NICU IV INFUSION SIMPLE
INJECTION | INTRAVENOUS | Status: AC
  Filled 2020-04-22 (×3): qty 500

## 2020-04-22 MED ORDER — KETOROLAC TROMETHAMINE 30 MG/ML IJ SOLN
30.0000 mg | Freq: Once | INTRAMUSCULAR | Status: AC
  Administered 2020-04-22: 30 mg via INTRAVENOUS
  Filled 2020-04-22: qty 1

## 2020-04-22 MED ORDER — OXYCODONE HCL 5 MG PO TABS
5.0000 mg | ORAL_TABLET | ORAL | Status: DC | PRN
  Administered 2020-04-22 – 2020-04-23 (×5): 10 mg via ORAL
  Filled 2020-04-22 (×5): qty 2

## 2020-04-22 MED ORDER — OXYCODONE HCL 5 MG PO TABS
5.0000 mg | ORAL_TABLET | Freq: Once | ORAL | Status: AC
Start: 2020-04-22 — End: 2020-04-22
  Administered 2020-04-22: 5 mg via ORAL
  Filled 2020-04-22: qty 1

## 2020-04-22 NOTE — Progress Notes (Signed)
Called Amanda Burch @ 2101 due to patient rating pain 10/10 in her arms and legs. Patient did not any medication that she could receive at that time due to previously taking them. Patient has sickle cell. Dr. Barb Merino gave orders for Normal saline 100 ml/hr and Toradol . Will continue to monitor patient.

## 2020-04-22 NOTE — Lactation Note (Addendum)
This note was copied from a baby's chart. Lactation Consultation Note  Patient Name: Amanda Burch [id number] Date: 04/22/2020 Reason for consult: Primapara;1st time breastfeeding;Term;Follow-up assessment;NICU baby;Infant weight loss Age:20 hours  Visited with mom of 28 hours old FT NICU female, she's a P1 and reported (+) breast changes during the pregnancy. Mom has sickle cell disease, when LC came into her room she voiced she was in pain but she was just been given pain medication. This was a teen pregnancy, mom is 56 y.o.   She hasn't been pumping consistently, the last time she pumped was yesterday and she told LC that she didn't get "anything". Explained to mom that the purpose of pumping this early on is mainly for breast stimulation and not to get volume. Asked mom to call for assistance when needed.  Reviewed pumping schedule, pumping log and hand expression technique. Mom was able to do teach back and got a small droplet of colostrum out of her left breast, praised her for her efforts. Mom also noted some discomfort on her nipples, LC asked for coconut oil and advised her to use it until she gets more colostrum to rub it on her breast for breast care.  Feeding plan  1. Encouraged mom to pump every 2-3 hours, ideally 8 pumping sessions/24 hours. She'll go at her own pace though since she's dealing with sickle cell disease pain  2. Hand expression and breast massage were also encouraged prior pumping  BF brochure, BF resources and NICU booklet were reviewed. FOB present but asleep during [facility] consultation. Mom reported all questions and concerns were answered, she's aware of LC OP services and will call PRN.   Maternal Data    Feeding Mother's Current Feeding Choice: Breast Milk and Formula   Lactation Tools Discussed/Used Tools: Pump;Coconut oil Breast pump type: Double-Electric Breast Pump Pump Education: Setup, frequency, and cleaning;Milk Storage Reason for Pumping: baby in  NICU Pumping frequency: q 3 hours Pumped volume: 0 mL  Interventions Interventions: Breast feeding basics reviewed;Coconut oil;Breast massage;Hand express;Breast compression;DEBP;Education  Discharge Pump: DEBP WIC Program: Yes  Consult Status Consult Status: Follow-up Date: 04/22/20 Follow-up type: In-patient    Amanda Burch 04/22/2020, 3:40 PM

## 2020-04-22 NOTE — Progress Notes (Addendum)
POSTPARTUM PROGRESS NOTE  Subjective: Amanda Burch is a 20 y.o. G1P1001 POD#1 pLTCS at [redacted]w[redacted]d.  She reports she's doing well. No acute events overnight. She denies any problems with ambulating, voiding or po intake. Denies nausea or vomiting. She has passed flatus. Pain is well controlled.  Lochia is appropriate.  Objective: Blood pressure 127/88, pulse 64, temperature 97.7 F (36.5 C), resp. rate 20, height 5\' 4"  (1.626 m), weight 96.6 kg, last menstrual period 09/02/2019, SpO2 100 %, unknown if currently breastfeeding.  Physical Exam:  General: alert, cooperative and no distress Chest: no respiratory distress Abdomen: soft, appropriately tender. Honeycomb dressing c/d/i Uterine Fundus: firm and at level of umbilicus Incision: pressure dressing c/d/i Extremities: No calf swelling or tenderness  Recent Labs    04/19/20 2352 04/21/20 0452  HGB 10.7* 10.4*  HCT 28.5* 28.3*    Assessment/Plan: Amanda Burch is a 20 y.o. G1P1001 on POD#1 s/p pLTCS at [redacted]w[redacted]d for NRFHTs. Initially presented for IOL on 2/24 due to sickle cell disease and had prolonged IOL course.  Routine Postpartum Care: Doing well, pain well-controlled.  -- Continue routine care -- Contraception: Still undecided, will continue to discuss -- Feeding: Breast, lactation support prn -- Circ: desired, consented (baby in NICU)  #Sickle Cell Anemia: Postpartum pain adequately controlled. POD#1 Hgb pending #Late PNC: SW consult  Dispo: Anticipate discharge tomorrow, 2/28.   3/28, MD PGY-1 Family Medicine 04/22/2020 6:58 AM   GME ATTESTATION:  I saw and evaluated the patient. I agree with the findings and the plan of care as documented in the resident's note.  04/24/2020, MD OB Fellow, Faculty [facility], Center for [facility] 04/22/2020 8:15 AM

## 2020-04-22 NOTE — Progress Notes (Signed)
Called Lynnda Shields to update her on the patients pain. Patient reports pain being 9/10. Patient did not want any  medication at this time" states that it does not help with the sickle cell pain". Will continue to monitor patient and update doctor.

## 2020-04-22 NOTE — Progress Notes (Addendum)
Patient had an order for Type and screen to be done at 12 midnight on 04/22/20 . Blood bank states that she still has a  Type and screen  That will be good until 8pm on 2/28. Spoke with [place] in blood bank.[place]  States that the patient  will be able to get blood off the current type and screen if needed until 8pm Notified Dr. Salli Real @ 105 am that a new type and screen would need to be put in .

## 2020-04-22 NOTE — Clinical Social Work Maternal (Signed)
CLINICAL SOCIAL WORK MATERNAL/CHILD NOTE  Patient Details  Name: Amanda Burch MRN: [id number] Date of Birth: 06/24/2000  Date:  May 22, 2020  Clinical Social Worker Initiating Note:  Laurey Arrow Date/Time: Initiated:  04/22/20/1415     Child's Name:  Amanda Burch   Biological Parents:  Mother (MOB communicated that she will be taking a DNA test in the future to establish paternity.)   Need for Interpreter:  None   Reason for Referral:  Late or No Prenatal Care ,Behavioral Health Concerns   Address:  [address] [place] [address] [place] [address]    Phone number:  [phone] (home)     Additional phone number: Potential FOB's number is [phone]  Household Members/Support Persons (HM/SP):    (MOB reported that resides with her "Amanda Burch best friend and his girl friend.)   HM/SP Name Relationship DOB or Age  HM/SP -1        HM/SP -2        HM/SP -3        HM/SP -4        HM/SP -5        HM/SP -6        HM/SP -7        HM/SP -8          Natural Supports (not living in the home):  Immediate Family,Extended Conservator, museum/gallery other   Professional Supports: None   Employment: Retail buyer (Advertising copywriter)   Type of Work:     Education:  Programmer, systems   Homebound arranged:    Museum/gallery curator Resources:  [employer]   Other Resources:  [place] (CSW provided MOB with information to apply for [employer].)   Cultural/Religious Considerations Which May Impact Care:    Strengths:  Ability to meet basic needs ,Home prepared for child  (CSW provided MOB with a list of pediatric providers)   Psychotropic Medications:         Pediatrician:       Pediatrician List:   [place]      Pediatrician Fax Number:    Risk Factors/Current Problems:  Estate manager/land agent:  Alert ,Able to Concentrate ,Insightful ,Linear Thinking    Mood/Affect:   Interested ,Calm ,Comfortable ,Happy    CSW Assessment: CSW met with MOB in room 406 to complete an assessment for a consult for Late [facility] and MH hx.  When CSW arrived, MOB was resting in the recliner and FOB was sitting on the couch.  CSW explained CSW's role and with MOB's permission, CSW asked FOB to leave in order for CSW to assess MOB in private; FOB left without   incident. MOB was inviting, polite, and engaging.  CSW inquired about MOB's Late PNC and MOB stated, "I really didn't know what to do and it was last when I found out I was pregnant."  CSW assessed MOB for barriers for attending follow-up appointments for infant and MOB.  MOB acknowledged that transportation maybe a barrier. CSW provided MOB with resources for transportation including [employer], [employer], and resources for [employer]. MOB reported that she has a good support team that consists of MOB's family and FOB's family that will provide help if needed. MOB communicated that MOB feels prepared to care for infant post discharge and shared that she has all essential items. CSW inquired about car  seat and a safe place for the infant to sleep.  MOB stated that she has a used car seat.  MOB encouraged MOB to check for expiration and MOB agreed. MOB also shared that she has a pack n play for infant. CSW reviewed hospital's policy regarding Late PNC and MOB was understanding. MOB denied having any questions or concerns and she also denied the use of all illicit substances. CSW informed MOB that the infant's UDS and CDS results are pending and CSW will continue to monitor them. CSW made MOB aware that if infant's  results are positive for  any substance without an explanation, CSW will make a report to [facility] CPS. MOB understood the hospital's policy and procedures and did not have any questions or concerns.  CSW also asked about MOB's MH hx. MOB denied a hx of depression, however reported feeling sad and overwhelmed  throughout her pregnancy. CSW validated and normalized MOB's thoughts and feelings and shared other emotions that MOB may experience during the postpartum period.  MOB presented with insight and awareness and did not demonstrate any acute MH symptoms.  MOB communicated feeling comfortable seeking help if help is needed. CSW provided education regarding the baby blues period vs. perinatal mood disorders, discussed treatment and gave resources for mental health follow up if concerns arise.  CSW recommends self-evaluation during the postpartum time period using the New Mom Checklist from Postpartum Progress and encouraged MOB to contact a medical professional if symptoms are noted at any time. CSW assessed for safety and MOB denied SI, HI, and DV.  CSW provided Sudden Infant Death Syndrome (SIDS) education. MOB appropriately answered MOB's questions and asked appropriate questions.   MOB voice help with finding her "Own place." CSW agreed to provide MOB with a local housing list (List was left at infant's bedside) CSW will continue to offer resources and supports to family while infant remains in NICU.    CSW Plan/Description:  Psychosocial Support and Ongoing Assessment of Needs,Sudden Infant Death Syndrome (SIDS) Education,Perinatal Mood and Anxiety Disorder (PMADs) Education,Other Patient/Family Education,Hospital Drug Screen Policy Information,Other Information/Referral to [employer] Will Continue to Monitor Umbilical Cord Tissue Drug Screen Results and Make Report if Warranted   Laurey Arrow, MSW, LCSW Clinical Social Work [phone]  Dimple Nanas, LCSW 04/22/2020, 2:19 PM

## 2020-04-23 ENCOUNTER — Other Ambulatory Visit: Payer: Self-pay

## 2020-04-23 ENCOUNTER — Encounter: Payer: Self-pay | Admitting: Obstetrics & Gynecology

## 2020-04-23 DIAGNOSIS — D571 Sickle-cell disease without crisis: Secondary | ICD-10-CM

## 2020-04-23 DIAGNOSIS — A749 Chlamydial infection, unspecified: Secondary | ICD-10-CM

## 2020-04-23 DIAGNOSIS — O98812 Other maternal infectious and parasitic diseases complicating pregnancy, second trimester: Secondary | ICD-10-CM

## 2020-04-23 DIAGNOSIS — Q8901 Asplenia (congenital): Secondary | ICD-10-CM

## 2020-04-23 DIAGNOSIS — O0990 Supervision of high risk pregnancy, unspecified, unspecified trimester: Secondary | ICD-10-CM

## 2020-04-23 DIAGNOSIS — O0930 Supervision of pregnancy with insufficient antenatal care, unspecified trimester: Secondary | ICD-10-CM

## 2020-04-23 LAB — BPAM RBC
Blood Product Expiration Date: 202203252359
Blood Product Expiration Date: 202203252359
Unit Type and Rh: 6200
Unit Type and Rh: 6200

## 2020-04-23 LAB — CBC
HCT: 25.2 % — ABNORMAL LOW (ref 36.0–46.0)
HCT: 24.6 % — ABNORMAL LOW (ref 36.0–46.0)
Hemoglobin: 9.2 g/dL — ABNORMAL LOW (ref 12.0–15.0)
Hemoglobin: 9 g/dL — ABNORMAL LOW (ref 12.0–15.0)
MCH: 32.2 pg (ref 26.0–34.0)
MCH: 31.9 pg (ref 26.0–34.0)
MCHC: 36.5 g/dL — ABNORMAL HIGH (ref 30.0–36.0)
MCHC: 36.6 g/dL — ABNORMAL HIGH (ref 30.0–36.0)
MCV: 88.1 fL (ref 80.0–100.0)
MCV: 87.2 fL (ref 80.0–100.0)
Platelets: 238 10*3/uL (ref 150–400)
Platelets: 220 10*3/uL (ref 150–400)
RBC: 2.86 MIL/uL — ABNORMAL LOW (ref 3.87–5.11)
RBC: 2.82 MIL/uL — ABNORMAL LOW (ref 3.87–5.11)
RDW: 14.3 % (ref 11.5–15.5)
RDW: 14.1 % (ref 11.5–15.5)
WBC: 17.3 10*3/uL — ABNORMAL HIGH (ref 4.0–10.5)
WBC: 15.7 10*3/uL — ABNORMAL HIGH (ref 4.0–10.5)
nRBC: 7.5 % — ABNORMAL HIGH (ref 0.0–0.2)
nRBC: 5.8 % — ABNORMAL HIGH (ref 0.0–0.2)

## 2020-04-23 LAB — COMPREHENSIVE METABOLIC PANEL
ALT: 18 U/L (ref 0–44)
ALT: 18 U/L (ref 0–44)
AST: 35 U/L (ref 15–41)
AST: 30 U/L (ref 15–41)
Albumin: 2.3 g/dL — ABNORMAL LOW (ref 3.5–5.0)
Albumin: 2.3 g/dL — ABNORMAL LOW (ref 3.5–5.0)
Alkaline Phosphatase: 123 U/L (ref 38–126)
Alkaline Phosphatase: 125 U/L (ref 38–126)
Anion gap: 6 (ref 5–15)
Anion gap: 9 (ref 5–15)
BUN: 6 mg/dL (ref 6–20)
BUN: 5 mg/dL — ABNORMAL LOW (ref 6–20)
CO2: 27 mmol/L (ref 22–32)
CO2: 24 mmol/L (ref 22–32)
Calcium: 8.4 mg/dL — ABNORMAL LOW (ref 8.9–10.3)
Calcium: 8.3 mg/dL — ABNORMAL LOW (ref 8.9–10.3)
Chloride: 104 mmol/L (ref 98–111)
Chloride: 102 mmol/L (ref 98–111)
Creatinine, Ser: 0.77 mg/dL (ref 0.44–1.00)
Creatinine, Ser: 0.75 mg/dL (ref 0.44–1.00)
GFR, Estimated: >60 mL/min (ref >60)
GFR, Estimated: >60 mL/min (ref >60)
Glucose, Bld: 86 mg/dL (ref 70–99)
Glucose, Bld: 96 mg/dL (ref 70–99)
Potassium: 4.1 mmol/L (ref 3.5–5.1)
Potassium: 3.7 mmol/L (ref 3.5–5.1)
Sodium: 137 mmol/L (ref 135–145)
Sodium: 135 mmol/L (ref 135–145)
Total Bilirubin: 0.8 mg/dL (ref 0.3–1.2)
Total Bilirubin: 0.7 mg/dL (ref 0.3–1.2)
Total Protein: 5.6 g/dL — ABNORMAL LOW (ref 6.5–8.1)
Total Protein: 5.4 g/dL — ABNORMAL LOW (ref 6.5–8.1)

## 2020-04-23 LAB — RETICULOCYTES
Immature Retic Fract: 35.9 % — ABNORMAL HIGH (ref 2.3–15.9)
RBC.: 2.8 MIL/uL — ABNORMAL LOW (ref 3.87–5.11)
Retic Count, Absolute: 206.1 10*3/uL — ABNORMAL HIGH (ref 19.0–186.0)
Retic Ct Pct: 7.4 % — ABNORMAL HIGH (ref 0.4–3.1)

## 2020-04-23 LAB — TYPE AND SCREEN
ABO/RH(D): A POS
Antibody Screen: NEGATIVE
Unit division: 0
Unit division: 0

## 2020-04-23 MED ORDER — AMLODIPINE BESYLATE 5 MG PO TABS
5.0000 mg | ORAL_TABLET | Freq: Every day | ORAL | Status: DC
  Administered 2020-04-23 – 2020-04-25 (×3): 5 mg via ORAL
  Filled 2020-04-23 (×3): qty 1

## 2020-04-23 MED ORDER — FOLIC ACID 1 MG PO TABS
1.0000 mg | ORAL_TABLET | Freq: Every day | ORAL | Status: DC
  Administered 2020-04-25 – 2020-04-26 (×2): 1 mg via ORAL
  Filled 2020-04-23 (×2): qty 1

## 2020-04-23 MED ORDER — BUTALBITAL-APAP-CAFFEINE 50-325-40 MG PO TABS
1.0000 | ORAL_TABLET | Freq: Once | ORAL | Status: AC
  Administered 2020-04-23: 1 via ORAL
  Filled 2020-04-23: qty 1

## 2020-04-23 MED ORDER — ONDANSETRON HCL 4 MG/2ML IJ SOLN
4.0000 mg | Freq: Four times a day (QID) | INTRAMUSCULAR | Status: DC | PRN
  Administered 2020-04-24: 4 mg via INTRAVENOUS
  Filled 2020-04-23: qty 2

## 2020-04-23 MED ORDER — HYDROMORPHONE HCL 1 MG/ML IJ SOLN
0.5000 mg | INTRAMUSCULAR | Status: DC | PRN
  Administered 2020-04-23 (×6): 0.5 mg via INTRAVENOUS
  Filled 2020-04-23 (×6): qty 1

## 2020-04-23 MED ORDER — KETOROLAC TROMETHAMINE 30 MG/ML IJ SOLN
15.0000 mg | Freq: Four times a day (QID) | INTRAMUSCULAR | Status: DC
  Administered 2020-04-23 – 2020-04-26 (×10): 15 mg via INTRAVENOUS
  Filled 2020-04-23 (×10): qty 1

## 2020-04-23 MED ORDER — SODIUM CHLORIDE 0.45 % IV SOLN
INTRAVENOUS | Status: DC
  Filled 2020-04-23 (×3): qty 1000

## 2020-04-23 MED ORDER — DIPHENHYDRAMINE HCL 12.5 MG/5ML PO ELIX
12.5000 mg | ORAL_SOLUTION | Freq: Four times a day (QID) | ORAL | Status: DC | PRN
  Filled 2020-04-23: qty 5

## 2020-04-23 MED ORDER — NALOXONE HCL 0.4 MG/ML IJ SOLN: 0.4000 mg | INTRAMUSCULAR | Status: DC | PRN

## 2020-04-23 MED ORDER — KETOROLAC TROMETHAMINE 30 MG/ML IJ SOLN
30.0000 mg | Freq: Four times a day (QID) | INTRAMUSCULAR | Status: DC
  Administered 2020-04-23: 30 mg via INTRAVENOUS
  Filled 2020-04-23: qty 1

## 2020-04-23 MED ORDER — KETOROLAC TROMETHAMINE 30 MG/ML IJ SOLN
30.0000 mg | Freq: Three times a day (TID) | INTRAMUSCULAR | Status: DC
  Administered 2020-04-23: 30 mg via INTRAVENOUS
  Filled 2020-04-23: qty 1

## 2020-04-23 MED ORDER — HYDROMORPHONE 1 MG/ML IV SOLN
INTRAVENOUS | Status: DC
Start: 2020-04-23 — End: 2020-04-24
  Administered 2020-04-23: 0.6 mg via INTRAVENOUS
  Administered 2020-04-23: 30 mg via INTRAVENOUS
  Administered 2020-04-24: 0.3 mg via INTRAVENOUS
  Filled 2020-04-23: qty 30

## 2020-04-23 MED ORDER — POLYETHYLENE GLYCOL 3350 17 G PO PACK
17.0000 g | PACK | Freq: Every day | ORAL | Status: DC
  Filled 2020-04-23: qty 1

## 2020-04-23 MED ORDER — OXYCODONE-ACETAMINOPHEN 5-325 MG PO TABS
1.0000 | ORAL_TABLET | ORAL | Status: DC | PRN
  Administered 2020-04-23 – 2020-04-26 (×9): 1 via ORAL
  Filled 2020-04-23 (×11): qty 1

## 2020-04-23 MED ORDER — ENOXAPARIN SODIUM 60 MG/0.6ML ~~LOC~~ SOLN
50.0000 mg | SUBCUTANEOUS | Status: DC
  Administered 2020-04-24 – 2020-04-26 (×3): 50 mg via SUBCUTANEOUS
  Filled 2020-04-23 (×3): qty 0.6

## 2020-04-23 MED ORDER — SODIUM CHLORIDE 0.9% FLUSH: 9.0000 mL | INTRAVENOUS | Status: DC | PRN

## 2020-04-23 NOTE — Progress Notes (Signed)
Called Amanda Burch per patients request to get stronger pain medication. Patient was rating her pain 10/10. Dr. Barb Merino ordered dilaudid and Toradol .

## 2020-04-23 NOTE — Progress Notes (Signed)
Assess patients pain after receiving the dilaudid and Toradol.Patient  states that  her pain is a zero when she is sleeping . States that when she starts moving her pain is 10 Will continue to monitor

## 2020-04-23 NOTE — Progress Notes (Signed)
POSTPARTUM PROGRESS NOTE  Subjective: Amanda Burch is a 20 y.o. G1P1001 POD#2 pLTCS at [redacted]w[redacted]d.  She has had increasing pain in her arms and legs overnight. She has required dilaudid, which makes her fall asleep, does relieve the pain. She does state it is difficult to ambulate 2/2 her leg pain. She denies any problems voiding or po intake. Denies nausea or vomiting. She has passed flatus. Pain is well controlled.  Lochia is appropriate.  Objective: Blood pressure 129/84, pulse 92, temperature 98.5 F (36.9 C), temperature source Axillary, resp. rate 18, height 5\' 4"  (1.626 m), weight 96.6 kg, last menstrual period 09/02/2019, SpO2 100 %, unknown if currently breastfeeding.  Physical Exam:  General: alert, cooperative and no distress Chest: no respiratory distress Abdomen: soft, appropriately tender.  Uterine Fundus: firm and at level of umbilicus Incision: Honeycomb dressing c/d/i Extremities: No calf swelling. Tenderness diffusely UE/LE.  Recent Labs    04/22/20 0440 04/23/20 0244  HGB 9.0* 9.2*  HCT 24.7* 25.2*    Assessment/Plan: Amanda Burch is a 20 y.o. G1P1001 on POD#2 s/p pLTCS at [redacted]w[redacted]d for NRFHTs. Initially presented for IOL on 2/24 due to sickle cell disease and had prolonged IOL course.  Routine Postpartum Care: Meeting pp milestones -- Contraception: Still undecided, will continue to discuss -- Feeding: Breast, lactation support prn -- Circ: desired, previously consented (baby in NICU)  #Sickle Cell Anemia: POD#1 hgb 9.0, around patient's baseline, will defer iron. Endorses upper/lower extremity pain since delivery, improved with dilaudid and oxycodone. Discussed with oncology- they will reach out to sickle cell team to potentially evaluate patient and set up outpatient follow up. #Late PNC: SW consult  Dispo: Anticipate discharge tomorrow, 2/29 given current pain   3/29, MD PGY-1 Family Medicine 04/23/2020 9:13 AM

## 2020-04-23 NOTE — Lactation Note (Signed)
This note was copied from a baby's chart. Lactation Consultation Note  Patient Name: Amanda Burch HGDJM'[id number] Date: 04/23/2020 Reason for consult: Follow-up assessment;Primapara;1st time breastfeeding;Term;NICU baby;Other (Comment) (P 1, per mom has not pumped since Saturdaydue to not feeling well , but today feeling better.  LC discussed supply and demand , also being on the early stage of milk production and since she feels better, to page for [facility] for review of the pump setttings.) Age:63 hours  Maternal Data    Feeding Mother's Current Feeding Choice: Breast Milk and Formula Nipple Type: Nfant Slow Flow (purple)  LATCH Score                    Lactation Tools Discussed/Used Tools: Pump (per mom has not pumped since due to not since Saturday due to not feeling well . today feeling better) Breast pump type: Double-Electric Breast Pump (was set up Saturday)  Interventions Interventions: Breast feeding basics reviewed;DEBP  Discharge WIC Program: Yes  Consult Status Consult Status: Follow-up Date: 04/24/20 Follow-up type: In-patient    Matilde Sprang Keiden Deskin 04/23/2020, 1:24 PM

## 2020-04-23 NOTE — Progress Notes (Signed)
Dr. Barb Merino notified around 2030 regarding patient refusing to take BP medication (norvasc) and complaints of a headache that she was then rating a 7. Percocet was given at 1911 but did not improve patients pain. Dr. Barb Merino to visit patient regarding symptoms and medication. Patient then decided to visit NICU accompanied by her sister and help of RN. Dr. Barb Merino visited patient after returning to [facility] around 2130. After speaking with MD, pt would like to take the norvasc to also help with BP and headache along with the one time dose of fioricet ordered by MD. Will reassess in one hour and notify MD of patient status. Will continue to monitor.

## 2020-04-23 NOTE — Progress Notes (Signed)
Called pharmacy about warning for the Hydromorphone ordered states that it should be ok .States that it was more of a sensitivity. Will continue to monitor patient.

## 2020-04-23 NOTE — Discharge Summary (Signed)
Postpartum Discharge Summary  Patient Name: Amanda Burch DOB: Oct 22, 2000 MRN: [id number]  Date of admission: 04/19/2020 Delivery date:04/21/2020  Delivering provider: CONSTANT, PEGGY  Date of discharge: 04/25/2020  Admitting diagnosis: Sickle cell anemia of mother during pregnancy ([place]) [O99.019, D57.1] Intrauterine pregnancy: [redacted]w[redacted]d     Secondary diagnosis:  Principal Problem:   Cesarean delivery delivered Active Problems:   GBS bacteriuria   Chlamydia infection affecting pregnancy in second trimester   Sickle cell anemia ([place])   Late prenatal care complicating pregnancy   Functional asplenia   Indication for care in labor or delivery Preeclampsia with severe features Headache  Additional problems: as noted above   Discharge diagnosis: Cesarean delivery delivered                                           Post partum procedures:none Augmentation: AROM, Pitocin, Cytotec and IP Foley Complications: Fetal intolerance of labor, Arrest of dilation at 5-6 cm  Hospital course: Induction of Labor With Cesarean Section   20 y.o. yo G1P1001 at [redacted]w[redacted]d was admitted to the hospital 04/19/2020 for induction of labor. Patient had a labor course significant for sickle cell disease. The patient went for cesarean section due to fetal intolerance of labor. Delivery details are as follows: Membrane Rupture Time/Date: 5:33 AM ,04/21/2020   Delivery Method:C-Section, Low Transverse  Details of operation can be found in separate operative Note. Patient had a postpartum course complicated by mild sickle cell pain crisis; she was continued on IVF and started on a PCA pump for superior pain control. The sickle cell team was consulted and evaluated pt prior to discharge.    Due to significant headache- concern for preeclampsia with severe features.  Pt treated with IV Magnesium and Norvasc daily.  Due to persistent now postural headache, pt also treated with IV caffeine.  Upon improvement of her headache, plan  was made for discharge home.   She is ambulating, tolerating a regular diet, passing flatus, and urinating well. Social work met with patient prior to discharge; provided housing resources. Patient is discharged home in stable condition on 04/26/20.   She was discharge on amlodipine $RemoveBefor'5mg'tIQaLEkDfwUV$  daily with plan for 1 week blood pressure check in clinic.  Newborn Data: Birth date:04/21/2020  Birth time:11:07 AM  Gender:Female  Living status:Living  Apgars:7 ,8  Weight:2920 g                                Magnesium Sulfate received: No BMZ received: No Rhophylac:N/A MMR:N/A T-DaP:Given prenatally Flu: offered prior to discharge Transfusion:No  Physical exam  Vitals:   04/25/20 2340 04/25/20 2345 04/26/20 0402 04/26/20 0724  BP:  125/87 108/72 130/65  Pulse:  67 80 71  Resp:  $Remo'16 16 17  'acQMn$ Temp:  98.5 F (36.9 C) 98.3 F (36.8 C) 98 F (36.7 C)  TempSrc:  Oral Oral Oral  SpO2: 100% 100% 98% 98%  Weight:      Height:       General: alert, cooperative and no distress Lochia: appropriate Uterine Fundus: firm Incision: Healing well with no significant drainage, No significant erythema, Dressing is clean, dry, and intact DVT Evaluation: No evidence of DVT seen on physical exam. No cords or calf tenderness. No significant calf/ankle edema. Labs: Lab Results  Component Value Date   WBC 12.0 (H)  04/24/2020   HGB 9.7 (L) 04/24/2020   HCT 26.7 (L) 04/24/2020   MCV 87.3 04/24/2020   PLT 211 04/24/2020   CMP Latest Ref Rng & Units 04/24/2020  Glucose 70 - 99 mg/dL 117(H)  BUN 6 - 20 mg/dL <5(L)  Creatinine 0.44 - 1.00 mg/dL 0.67  Sodium 135 - 145 mmol/L 136  Potassium 3.5 - 5.1 mmol/L 3.7  Chloride 98 - 111 mmol/L 103  CO2 22 - 32 mmol/L 24  Calcium 8.9 - 10.3 mg/dL 8.7(L)  Total Protein 6.5 - 8.1 g/dL 6.4(L)  Total Bilirubin 0.3 - 1.2 mg/dL 0.9  Alkaline Phos 38 - 126 U/L 140(H)  AST 15 - 41 U/L 29  ALT 0 - 44 U/L 20   Edinburgh Score: Edinburgh Postnatal Depression Scale  Screening Tool 04/23/2020  I have been able to laugh and see the funny side of things. 1  I have looked forward with enjoyment to things. 0  I have blamed myself unnecessarily when things went wrong. 3  I have been anxious or worried for no good reason. 2  I have felt scared or panicky for no good reason. 2  Things have been getting on top of me. 1  I have been so unhappy that I have had difficulty sleeping. 1  I have felt sad or miserable. 2  I have been so unhappy that I have been crying. 2  The thought of harming myself has occurred to me. 1  Edinburgh Postnatal Depression Scale Total 15     After visit meds:  Allergies as of 04/26/2020      Reactions   Phenergan [promethazine] Other (See Comments)   Received in setting of IOL for anxiolytic prior to attempted foley balloon placement. Altered mental status, respiratory distress, intense thirst   Stadol [butorphanol] Other (See Comments)   Received in setting of IOL for pain control prior to attempted foley balloon placement. Altered mental status, respiratory distress, intense thirst      Medication List    STOP taking these medications   acetaminophen 500 MG tablet Commonly known as: TYLENOL     TAKE these medications   amLODipine 5 MG tablet Commonly known as: NORVASC Take 1 tablet (5 mg total) by mouth daily.   docusate sodium 100 MG capsule Commonly known as: Colace Take 1 capsule (100 mg total) by mouth 2 (two) times daily as needed for mild constipation.   folic acid 1 MG tablet Commonly known as: FOLVITE Take 1 tablet (1 mg total) by mouth daily.   ibuprofen 600 MG tablet Commonly known as: ADVIL Take 1 tablet (600 mg total) by mouth every 6 (six) hours as needed.   ibuprofen 800 MG tablet Commonly known as: ADVIL Take 1 tablet (800 mg total) by mouth every 8 (eight) hours.   oxyCODONE 5 MG immediate release tablet Commonly known as: Roxicodone Take 1 tablet (5 mg total) by mouth every 6 (six) hours as  needed for severe pain or breakthrough pain.   oxyCODONE-acetaminophen 5-325 MG tablet Commonly known as: Percocet Take 1 tablet by mouth every 4 (four) hours as needed for severe pain.   PrePLUS 27-1 MG Tabs Take 1 tablet by mouth daily.        Discharge home in stable condition Infant Feeding: breast & bottle Infant Disposition:NICU Discharge instruction: per After Visit Summary and Postpartum booklet. Activity: Advance as tolerated. Pelvic rest for 6 weeks.  Diet: routine diet Future Appointments: Future Appointments  Date Time Provider [place]  05/01/2020  1:40 PM Dorena Dew, FNP [facility] None  05/02/2020  9:15 AM Woodroe Mode, MD [facility]  05/08/2020  8:15 AM [facility]  05/28/2020  2:55 PM Woodroe Mode, MD [facility]   Follow up Visit:  Follow-up Information    Dorena Dew, FNP Follow up.   Specialty: Family Medicine Contact information: [place]. [place] [address] [place] for [place] at [facility] Follow up in 1 week(s).   Specialty: Obstetrics and Gynecology Contact information: [place] [address] [phone]             Message sent to [facility] by Dr. Astrid Drafts  Please schedule this patient for a In person postpartum visit in 6 weeks with the following provider: Any provider. Additional Postpartum F/U:Postpartum Depression checkup and Incision check 1 week  High risk pregnancy complicated by: sickle cell disease, late PNC, Chlamydia (neg TOC), CS for fetal intolerance of labor, baby in NICU Delivery mode:  C-Section, Low Transverse  Anticipated Birth Control:  Unsure  Janyth Pupa, DO Attending [place], [place] for [employer], [place]

## 2020-04-23 NOTE — Progress Notes (Addendum)
Patient states that her pain is currently a 7 since taking the dilaudid and Toradol. Will continue to monitor the patient.Patient received second dose of dilaudid @ 529.

## 2020-04-23 NOTE — Progress Notes (Signed)
Brief hematology note:  Received request from Dr. Mart Piggs who requested [place] to see this patient due to leg pain and sickle cell disease.  She would also like for her to establish care for management of sickle cell.  Call placed to sickle cell team and confirmed that they performed inpatient consultations to help with pain management for sickle cell disease.  I called Dr. Germaine Pomfret and provided the contact information to the sickle cell team.  Clenton Pare, DNP, AGPCNP-BC, AOCNP

## 2020-04-23 NOTE — Consult Note (Signed)
Reason for Consult:Sickle cell pain crisis Referring Physician: Dr. Germaine Pomfret    HPI:  Amanda Burch is a 20 year old female with a medical history significant for sickle cell disease was admitted for on 04/19/2020 with IUP at 39 weeks. Patient underwent C-section on 04/21/2020.  Patient continues to complain of pain to upper and lower extremities that is consistent with previous sickle cell crisis. Prior to pregnancy, patient has fairly well controlled sickle cell disease with infrequent pain crisis. She says that pain intensity has been increasing over the past 48 hours. She rates pain as 6/10 characterized as constant and throbbing. Pain has been uncontrolled on current medication regimen. She denies any headache, blurred vision, dizziness, nausea, vomiting, or diarrhea.   Past Medical History:  Diagnosis Date  . Sickle cell anemia (HCC)   . Sickle cell disease with crisis (HCC) 03/31/2014  . Vision abnormalities     Past Surgical History:  Procedure Laterality Date  . CESAREAN SECTION N/A 04/21/2020   Procedure: CESAREAN SECTION;  Surgeon: Catalina Antigua, MD;  Location: [facility] LD [facility];  Service: Obstetrics;  Laterality: N/A;  . NO PAST SURGERIES      Family History  Adopted: Yes  Problem Relation Age of Onset  . Asthma Mother   . Sarcoidosis Mother   . Deep vein thrombosis Mother   . Asthma Father     Social History:  reports that she has never smoked. She has never used smokeless tobacco. She reports that she does not drink alcohol and does not use drugs.  Allergies:  Allergies  Allergen Reactions  . Phenergan [Promethazine] Other (See Comments)    Received in setting of IOL for anxiolytic prior to attempted foley balloon placement. Altered mental status, respiratory distress, intense thirst  . Stadol [Butorphanol] Other (See Comments)    Received in setting of IOL for pain control prior to attempted foley balloon placement. Altered mental status, respiratory distress, intense  thirst     Results for orders placed or performed during the hospital encounter of 04/19/20 (from the past 48 hour(s))  CBC     Status: Abnormal   Collection Time: 04/22/20  4:40 AM  Result Value Ref Range   WBC 19.4 (H) 4.0 - 10.5 K/uL    Comment: WHITE COUNT CONFIRMED ON SMEAR   RBC 2.82 (L) 3.87 - 5.11 MIL/uL   Hemoglobin 9.0 (L) 12.0 - 15.0 g/dL   HCT 14.4 (L) 31.5 - 40.0 %   MCV 87.6 80.0 - 100.0 fL   MCH 31.9 26.0 - 34.0 pg   MCHC 36.4 (H) 30.0 - 36.0 g/dL   RDW 86.7 61.9 - 50.9 %   Platelets 233 150 - 400 K/uL   nRBC 5.8 (H) 0.0 - 0.2 %    Comment: Performed at [facility], [address], [place], [place] [address]  Creatinine, serum     Status: None   Collection Time: 04/22/20  4:40 AM  Result Value Ref Range   Creatinine, Ser 0.76 0.44 - 1.00 mg/dL   GFR, Estimated >24 >58 mL/min    Comment: (NOTE) Calculated using the CKD-EPI Creatinine Equation (2021) Performed at [facility], [address] [place], [place] [address]   CBC     Status: Abnormal   Collection Time: 04/23/20  2:44 AM  Result Value Ref Range   WBC 17.3 (H) 4.0 - 10.5 K/uL   RBC 2.86 (L) 3.87 - 5.11 MIL/uL   Hemoglobin 9.2 (L) 12.0 - 15.0 g/dL  HCT 25.2 (L) 36.0 - 46.0 %   MCV 88.1 80.0 - 100.0 fL   MCH 32.2 26.0 - 34.0 pg   MCHC 36.5 (H) 30.0 - 36.0 g/dL   RDW 16.1 09.6 - 04.5 %   Platelets 238 150 - 400 K/uL   nRBC 7.5 (H) 0.0 - 0.2 %    Comment: Performed at [facility], [address], [place], [place] [address]  Reticulocytes     Status: Abnormal   Collection Time: 04/23/20  2:44 AM  Result Value Ref Range   Retic Ct Pct 7.4 (H) 0.4 - 3.1 %   RBC. 2.80 (L) 3.87 - 5.11 MIL/uL   Retic Count, Absolute 206.1 (H) 19.0 - 186.0 K/uL   Immature Retic Fract 35.9 (H) 2.3 - 15.9 %    Comment: Performed at [facility], [address] [place], [place] [address]  Comprehensive metabolic panel     Status: Abnormal   Collection Time: 04/23/20  2:44 AM  Result Value Ref  Range   Sodium 137 135 - 145 mmol/L   Potassium 4.1 3.5 - 5.1 mmol/L   Chloride 104 98 - 111 mmol/L   CO2 27 22 - 32 mmol/L   Glucose, Bld 86 70 - 99 mg/dL    Comment: Glucose reference range applies only to samples taken after fasting for at least 8 hours.   BUN 6 6 - 20 mg/dL   Creatinine, Ser 8.29 0.44 - 1.00 mg/dL   Calcium 8.4 (L) 8.9 - 10.3 mg/dL   Total Protein 5.6 (L) 6.5 - 8.1 g/dL   Albumin 2.3 (L) 3.5 - 5.0 g/dL   AST 35 15 - 41 U/L   ALT 18 0 - 44 U/L   Alkaline Phosphatase 123 38 - 126 U/L   Total Bilirubin 0.8 0.3 - 1.2 mg/dL   GFR, Estimated >56 >21 mL/min    Comment: (NOTE) Calculated using the CKD-EPI Creatinine Equation (2021)    Anion gap 6 5 - 15    Comment: Performed at [facility], [address], [place], [place] [address]    No results found.  Review of Systems  Constitutional: Negative.   HENT: Negative.   Eyes: Negative.   Respiratory: Negative.   Cardiovascular: Negative.   Gastrointestinal: Positive for abdominal pain.  Endocrine: Negative.   Genitourinary: Negative.   Musculoskeletal: Positive for arthralgias and back pain.  Neurological: Negative.   Hematological: Negative.   Psychiatric/Behavioral: Negative.    Blood pressure 129/84, pulse 92, temperature 98.5 F (36.9 C), temperature source Axillary, resp. rate 18, height 5\' 4"  (1.626 m), weight 96.6 kg, last menstrual period 09/02/2019, SpO2 100 %, unknown if currently breastfeeding. Physical Exam Constitutional:      Appearance: Normal appearance.  HENT:     Mouth/Throat:     Mouth: Mucous membranes are moist.     Pharynx: Oropharynx is clear.  Eyes:     Pupils: Pupils are equal, round, and reactive to light.  Cardiovascular:     Rate and Rhythm: Normal rate and regular rhythm.     Pulses: Normal pulses.  Pulmonary:     Effort: Pulmonary effort is normal.  Abdominal:     Tenderness: There is abdominal tenderness.  Musculoskeletal:        General: Normal range of  motion.  Skin:    General: Skin is warm.  Neurological:     General: No focal deficit present.     Mental Status: She is alert.  Mental status is at baseline.  Psychiatric:        Mood and Affect: Mood normal.        Behavior: Behavior normal.        Thought Content: Thought content normal.        Judgment: Judgment normal.     Assessment/Plan:   Sickle cell disease with pain crisis:  IV fluids 0.45% saline at 50 ml/hr Initiate IV dilaudid PCA per full dose overnight for greater pain control.  Patient mostly opiate naive Decrease to Toradol 15 mg IV every 6 hours Discontinue gabapentin Percocet 5-325 mg every 4 hours as needed for severe, breakthrough pain Folic acid 1 mg daily Monitor vital signs closely, evaluate pain scale regularly and supplemental oxygen as needed.  CBC w/differential in am  Patient will reevaluated for pain in the context of function and relationship to baseline as care progresses.  Will schedule outpatient follow up  Post partum:  Stable. Patient followed by obstetrics       Nolon Nations  APRN, MSN, FNP-C Patient Care [facility] [address] [place], [place] [address] [phone]  04/23/2020, 12:30 PM

## 2020-04-24 ENCOUNTER — Telehealth: Payer: Self-pay

## 2020-04-24 LAB — COMPREHENSIVE METABOLIC PANEL
ALT: 20 U/L (ref 0–44)
AST: 29 U/L (ref 15–41)
Albumin: 2.6 g/dL — ABNORMAL LOW (ref 3.5–5.0)
Alkaline Phosphatase: 140 U/L — ABNORMAL HIGH (ref 38–126)
Anion gap: 9 (ref 5–15)
BUN: <5 mg/dL — ABNORMAL LOW (ref 6–20)
CO2: 24 mmol/L (ref 22–32)
Calcium: 8.7 mg/dL — ABNORMAL LOW (ref 8.9–10.3)
Chloride: 103 mmol/L (ref 98–111)
Creatinine, Ser: 0.67 mg/dL (ref 0.44–1.00)
GFR, Estimated: >60 mL/min (ref >60)
Glucose, Bld: 117 mg/dL — ABNORMAL HIGH (ref 70–99)
Potassium: 3.7 mmol/L (ref 3.5–5.1)
Sodium: 136 mmol/L (ref 135–145)
Total Bilirubin: 0.9 mg/dL (ref 0.3–1.2)
Total Protein: 6.4 g/dL — ABNORMAL LOW (ref 6.5–8.1)

## 2020-04-24 LAB — CBC
HCT: 26.7 % — ABNORMAL LOW (ref 36.0–46.0)
Hemoglobin: 9.7 g/dL — ABNORMAL LOW (ref 12.0–15.0)
MCH: 31.7 pg (ref 26.0–34.0)
MCHC: 36.3 g/dL — ABNORMAL HIGH (ref 30.0–36.0)
MCV: 87.3 fL (ref 80.0–100.0)
Platelets: 211 10*3/uL (ref 150–400)
RBC: 3.06 MIL/uL — ABNORMAL LOW (ref 3.87–5.11)
RDW: 14.2 % (ref 11.5–15.5)
WBC: 12 10*3/uL — ABNORMAL HIGH (ref 4.0–10.5)
nRBC: 3.7 % — ABNORMAL HIGH (ref 0.0–0.2)

## 2020-04-24 MED ORDER — MAGNESIUM SULFATE 40 GM/1000ML IV SOLN
2.0000 g/h | INTRAVENOUS | Status: AC
  Administered 2020-04-24 – 2020-04-25 (×2): 2 g/h via INTRAVENOUS
  Filled 2020-04-24 (×3): qty 1000

## 2020-04-24 MED ORDER — IBUPROFEN 600 MG PO TABS
600.0000 mg | ORAL_TABLET | Freq: Four times a day (QID) | ORAL | Status: DC | PRN
  Administered 2020-04-24 – 2020-04-26 (×2): 600 mg via ORAL
  Filled 2020-04-24 (×2): qty 1

## 2020-04-24 MED ORDER — LABETALOL HCL 5 MG/ML IV SOLN: 20.0000 mg | INTRAVENOUS | Status: DC | PRN

## 2020-04-24 MED ORDER — LABETALOL HCL 5 MG/ML IV SOLN: 40.0000 mg | INTRAVENOUS | Status: DC | PRN

## 2020-04-24 MED ORDER — SODIUM CHLORIDE 0.9 % IV SOLN: 500.0000 mg | Freq: Once | INTRAVENOUS | Status: DC

## 2020-04-24 MED ORDER — HYDROMORPHONE HCL 1 MG/ML IJ SOLN
2.0000 mg | Freq: Once | INTRAMUSCULAR | Status: AC
  Administered 2020-04-24: 2 mg via INTRAVENOUS
  Filled 2020-04-24: qty 2

## 2020-04-24 MED ORDER — METOCLOPRAMIDE HCL 10 MG PO TABS
5.0000 mg | ORAL_TABLET | Freq: Four times a day (QID) | ORAL | Status: DC | PRN
  Administered 2020-04-24: 5 mg via ORAL
  Filled 2020-04-24: qty 1

## 2020-04-24 MED ORDER — LACTATED RINGERS IV SOLN: INTRAVENOUS | Status: DC

## 2020-04-24 MED ORDER — FOLIC ACID 1 MG PO TABS: 1.0000 mg | ORAL_TABLET | Freq: Every day | ORAL | 3 refills | Status: DC

## 2020-04-24 MED ORDER — MAGNESIUM SULFATE BOLUS VIA INFUSION
4.0000 g | Freq: Once | INTRAVENOUS | Status: AC
  Administered 2020-04-24: 4 g via INTRAVENOUS
  Filled 2020-04-24: qty 1000

## 2020-04-24 MED ORDER — IBUPROFEN 600 MG PO TABS: 600.0000 mg | ORAL_TABLET | Freq: Four times a day (QID) | ORAL | 0 refills | Status: DC | PRN

## 2020-04-24 MED ORDER — HYDRALAZINE HCL 20 MG/ML IJ SOLN
10.0000 mg | INTRAMUSCULAR | Status: DC | PRN
Start: 2020-04-24 — End: 2020-04-26

## 2020-04-24 MED ORDER — OXYCODONE-ACETAMINOPHEN 5-325 MG PO TABS: 1.0000 | ORAL_TABLET | ORAL | 0 refills | Status: DC | PRN

## 2020-04-24 MED ORDER — LABETALOL HCL 5 MG/ML IV SOLN
80.0000 mg | INTRAVENOUS | Status: DC | PRN
Start: 2020-04-24 — End: 2020-04-26

## 2020-04-24 NOTE — Progress Notes (Signed)
Dr. Barb Merino notified at 2330 that patient states improvement with headache after medication. Patient rated pain a 5, still states the throbbing is there after getting up to the bathroom but better than before. No new orders at this time. Will continue to monitor.

## 2020-04-24 NOTE — Plan of Care (Signed)
  Problem: Coping: Goal: Level of anxiety will decrease Outcome: Completed/Met   Problem: Respiratory: Goal: Pulmonary complications will be avoided or minimized Outcome: Completed/Met   Problem: Coping: Goal: Ability to identify and utilize available resources and services will improve Outcome: Completed/Met   Problem: Life Cycle: Goal: Chance of risk for complications during the postpartum period will decrease Outcome: Completed/Met   Problem: Role Relationship: Goal: Ability to demonstrate positive interaction with newborn will improve Outcome: Completed/Met   Problem: Education: Goal: Knowledge of disease or condition will improve Outcome: Completed/Met

## 2020-04-24 NOTE — BH Specialist Note (Deleted)
Integrated Behavioral Health via Telemedicine Visit  04/24/2020 Amanda Burch [id number]  Number of Integrated Behavioral Health visits: 1 Session Start time: 8:15***  Session End time: 9:15*** Total time: {IBH Total Time:21014050}  Referring Provider: *** Patient/Family location: Home*** [facility] Provider [facility]: [facility]  All persons participating in visit: Patient *** and [facility] Amanda Burch ***  Types of Service: {CHL AMB TYPE OF SERVICE:[phone]}  I connected with Amanda Burch and/or Amanda Burch {family members:20773} by Telephone  (Video is Caregility application) and verified that I am speaking with the correct person using two identifiers.Discussed confidentiality: {YES/NO:21197}  I discussed the limitations of telemedicine and the availability of in person appointments.  Discussed there is a possibility of technology failure and discussed alternative modes of communication if that failure occurs.  I discussed that engaging in this telemedicine visit, they consent to the provision of behavioral healthcare and the services will be billed under their insurance.  Patient and/or legal guardian expressed understanding and consented to Telemedicine visit: {YES/NO:21197}  Presenting Concerns: Patient and/or family reports the following symptoms/concerns: *** Duration of problem: ***; Severity of problem: {Mild/Moderate/Severe:20260}  Patient and/or Family's Strengths/Protective Factors: {CHL AMB BH PROTECTIVE FACTORS:[id number]}  Goals Addressed: Patient will: 1.  Reduce symptoms of: {IBH Symptoms:21014056}  2.  Increase knowledge and/or ability of: {IBH Patient Tools:21014057}  3.  Demonstrate ability to: {IBH Goals:21014053}  Progress towards Goals: {CHL AMB BH PROGRESS TOWARDS GOALS:[phone]}  Interventions: Interventions utilized:  {IBH Interventions:21014054} Standardized Assessments completed: {IBH  Screening Tools:21014051}  Patient and/or Family Response: ***  Assessment: Patient currently experiencing ***.   Patient may benefit from ***.  Plan: 1. Follow up with behavioral health clinician on : *** 2. Behavioral recommendations: *** 3. Referral(s): {IBH Referrals:21014055}  I discussed the assessment and treatment plan with the patient and/or parent/guardian. They were provided an opportunity to ask questions and all were answered. They agreed with the plan and demonstrated an understanding of the instructions.   They were advised to call back or seek an in-person evaluation if the symptoms worsen or if the condition fails to improve as anticipated.  Valetta Close Amanda Faulks, LCSW

## 2020-04-24 NOTE — Progress Notes (Signed)
Pt now resting in chair with eyes closed. Pt blood pressure now 138/89, heart rate 59. Dr. Nobie Putnam notified. No new orders gvien. Dr. Nobie Putnam declines given antihypertensives at this time. Will continue to monitor pt.

## 2020-04-24 NOTE — Progress Notes (Addendum)
POSTPARTUM PROGRESS NOTE  POD #3  Subjective:  Amanda Burch is a 20 y.o. G1P1001 s/p pLTCS at [redacted]w[redacted]d.  She reports she doing well. No acute events overnight. She reports she is doing well. She reports her headache is currently at a 5 on the pain scale. Reports that laying down helps with the headache. She denies any problems with ambulating, voiding or po intake. Denies nausea or vomiting. She has passed flatus. Pain is well controlled.  Lochia is mild "like a period".  Objective: Blood pressure (!) 147/90, pulse 82, temperature 99 F (37.2 C), temperature source Axillary, resp. rate 19, height 5\' 4"  (1.626 m), weight 96.6 kg, last menstrual period 09/02/2019, SpO2 100 %, unknown if currently breastfeeding.  Physical Exam:  General: alert, cooperative and no distress Chest: no respiratory distress Heart:regular rate, distal pulses intact Abdomen: soft, nontender,  Uterine Fundus: firm, appropriately tender DVT Evaluation: No calf swelling or tenderness Extremities: no edema Skin: warm, dry; incision clean/dry/intact w/ honeycomb dressing in place  Recent Labs    04/23/20 0244 04/23/20 1820  HGB 9.2* 9.0*  HCT 25.2* 24.6*    Assessment/Plan: Amanda Burch is a 20 y.o. G1P1001 s/p pLTCS at [redacted]w[redacted]d for fetal intolerance of labor, sickle cell crisis, and prolonged IOL.  POD#3 - Doing welll; pain well controlled. H/H appropriate.  Routine postpartum care  OOB, ambulated  Lovenox for VTE prophylaxis Contraception: Considering Depo for now and IUD at [facility] visit  Feeding: breast   #Sickle Cell Anemia: POD#1 hgb 9.0, around patient's baseline, will defer iron.  Patient currently on Dilaudid PCA pump.  Used 2.7 mg overnight total.  Also currently on Toradol 15 mg IV every 6 hours, gabapentin, Percocet every 4 hours for breakthrough pain.  Repeat sickle labs scheduled for tonight.  If continues to improve will plan for discharge tomorrow.  Headache  Mild range BPs: Mild range blood  pressures overnight with headache that rated 8/10 but improved with Fioricet to a 5/10.  Reports this morning that the headache is at a 5/10 but worse when she stands up.  Repeat preE labs were negative. We will continue to monitor.  Dispo: Plan for discharge unclear at this time.   LOS: 5 days   [facility], MD  PGY-1, [facility]  04/24/2020, 7:52 AM  Attestation of Supervision of Student:  I confirm that I have verified the information documented in the  resident's  note and that I have also personally reperformed the history, physical exam and all medical decision making activities.  I have verified that all services and findings are accurately documented in this student's note; and I agree with management and plan as outlined in the documentation. I have also made any necessary editorial changes.  06/24/2020, MD Center for [facility], [facility] 04/24/2020 8:51 AM

## 2020-04-24 NOTE — Progress Notes (Signed)
Pt complaining of nausea and headache 10/10. Pt blood  pressure 142/105 at 0949. Rechecked blood pressure 142/105 at 1005. Dr Derrel Nip notified. No new orders given. Dr. Derrel Nip came to pt bedside to evaluate pt. Dr Nobie Putnam verbalized he would speak to his Attending and call me back with new orders. Will continue to monitor pt.

## 2020-04-24 NOTE — Telephone Encounter (Signed)
Called pt and left v/m  to call office to sched. Hospital f/u

## 2020-04-24 NOTE — Progress Notes (Signed)
Amanda Burch is a 20 year old female with a medical history significant for sickle cell disease that is postpartum day 5.  Patient was treated overnight with PCA pump and sickle cell pain is well controlled today.  Patient continues to endorse bilateral headache as 5/10.  Headache pain is not affected by light or sound.  Patient denies any dizziness, blurred vision, nausea, vomiting, or diarrhea.  Patient will be discharged home with folic acid 1 mg daily, Percocet 5-3 25 every 6 hours as needed for severe breakthrough pain.  Also, ibuprofen 800 mg every 8 hours with food for mild to moderate pain.  Patient is released from the sickle cell standpoint.  Will defer to obstetrics for discharge.  Patient will follow-up at [facility] health patient care center in 1 week for sickle cell evaluation.    Nolon Nations  APRN, MSN, FNP-C Patient Care [facility] [address] [place], [place] [address] [phone]

## 2020-04-24 NOTE — Lactation Note (Signed)
This note was copied from a baby's chart. Lactation Consultation Note  Patient Name: Boy Terisa Belardo [id number] Date: 04/24/2020   Age:20 hours  Right before entering mom's room, RN informed me mom had a heahache 10/10 and now would not be a ideal time for a visit.  MD was entering room to assess mom.  LC will follow up with mom later.  Infant in NICU at this time.    Maternal Data    Feeding Nipple Type: Nfant Slow Flow (purple)  LATCH Score                    Lactation Tools Discussed/Used    Interventions    Discharge    Consult Status      Maryruth Hancock [facility] 04/24/2020, 10:13 AM

## 2020-04-24 NOTE — Social Work (Signed)
CSW attempted to meet with MOB to assess for [place]. MOB was in the process of being transferred to [facility]. CSW will follow-up with MOB.   Manfred Arch, MSW, [employer] Clinical Social Work [employer] and [employer] [phone]

## 2020-04-24 NOTE — Social Work (Signed)
CSW consulted due to [place] score of 15, scored a 1 on last question. CSW met with MOB to assess and offer support.   CSW introduced self and role. CSW observed visitors, MOB mom and FOB sister present in the room. CSW politely requested to speak with MOB alone. Visitors were understanding and exited the room. CSW informed MOB of reason for consult. MOB was open and engaged with CSW. MOB confirmed she answered the [place] based on the last 7 days. CSW asked MOB to further explain her thoughts of self harm. MOB reported "sometimes I have thoughts that if I could disappear, I wouldn't have to deal with this." MOB referenced her current headaches, high blood pressure and not being able to hold her infant in the NICU. CSW normalized MOB feelings of being sad during this time, considering the challenges she is experiencing. CSW asked MOB if she has ever thought of a plan to disappear. MOB reported "I'm not going to do anything, I have him to take care of," referring to her infant. MOB also referenced her family being a good support during this time, specifically her mother and FOB sister. MOB went on to express how she could really just use a vacation. CSW asked MOB if she would ever be interested in therapy postpartum to discuss the challenges she's experiencing. MOB reported she is not interested in therapy and that she always manages to get through things on her own.  CSW asked MOB if she currently feels safe in her environment, MOB stated yes. CSW again asked MOB if she has any plans on harming herself, MOB stated no. CSW encouraged MOB to notify staff if she has thoughts of SI with a plan in mind. MOB was understanding and in agreement.   MOB did not display in acute signs of mental health during the assessment. MOB has resources previously provided by CSW.   CSW identifies no further need for intervention and no barriers to discharge at this time.  Darra Lis, [place] Work [employer]  and [employer] [phone]

## 2020-04-25 MED ORDER — DEXAMETHASONE SODIUM PHOSPHATE 10 MG/ML IJ SOLN
10.0000 mg | Freq: Once | INTRAMUSCULAR | Status: AC
  Administered 2020-04-25: 10 mg via INTRAVENOUS
  Filled 2020-04-25: qty 1

## 2020-04-25 MED ORDER — LACTATED RINGERS IV BOLUS
300.0000 mL | Freq: Once | INTRAVENOUS | Status: AC
  Administered 2020-04-25: 300 mL via INTRAVENOUS

## 2020-04-25 MED ORDER — DIPHENHYDRAMINE HCL 50 MG/ML IJ SOLN
25.0000 mg | Freq: Once | INTRAMUSCULAR | Status: AC
  Administered 2020-04-25: 25 mg via INTRAVENOUS
  Filled 2020-04-25: qty 1

## 2020-04-25 MED ORDER — SODIUM CHLORIDE 0.9 % IV SOLN
500.0000 mg | Freq: Once | INTRAVENOUS | Status: AC
  Administered 2020-04-25: 500 mg via INTRAVENOUS
  Filled 2020-04-25: qty 2

## 2020-04-25 MED ORDER — METOCLOPRAMIDE HCL 5 MG/ML IJ SOLN
10.0000 mg | Freq: Once | INTRAMUSCULAR | Status: AC
  Administered 2020-04-25: 10 mg via INTRAVENOUS
  Filled 2020-04-25: qty 2

## 2020-04-25 NOTE — Progress Notes (Signed)
Called to evaluate Pt by Dr Charlotta Newton .  Pt is a 73 F G1P1 w hx of ss anemia ( last crisis Jan 22), migraine HA and ? PIH s/p Mg++ who is S/P c/s on 2/26 under ( uneventful) epidural now presents with a 6 hour HA  that is unrelieved by percocet and NSAIDs.  Pt in dark room  Sitting up.  Pt admits to photophobia ( "like before she got pregnant"), HA gets better when she lays down, and she has not been drinking as much since she got downstairs. Her Iv was discontinued earlier today. Pt stated that she was supposed to get her migraine  HA evaluated after the pregnancy.   Pt VSS.   Pt presentation atypical and clouded by SS, PIH and migraine Hx.  Will over IV caffeine tx  And hydration re check HA symptoms. If no improvement TBE by neurology tomorrow.    Druscilla Brownie, M.D.

## 2020-04-25 NOTE — Progress Notes (Signed)
POSTPARTUM PROGRESS NOTE  POD#4  Subjective:  Amanda Burch is a 20 y.o. G1P1001 s/p PLTCS at [redacted]w[redacted]d. Today she notes that she still has a headache- notes light sensitivity, throbbing discomfort at her forehead.  Rates her pain 7/10.  Notes h/o headache prior pregnancy and was in the process of establishing care. She denies any problems with ambulating, voiding or po intake. Mild nausea, no vomiting. She has + flatus, +BM. Other then her headache, she reports no other pain.  Lochia minimal Denies fever/chills/chest pain/SOB  Objective: Blood pressure 110/80, pulse 75, temperature 97.6 F (36.4 C), temperature source Oral, resp. rate 16, height 5\' 4"  (1.626 m), weight 96.6 kg, last menstrual period 09/02/2019, SpO2 100 %, unknown if currently breastfeeding.  Physical Exam:  General: alert, cooperative and no distress Chest: no respiratory distress Heart:regular rate, distal pulses intact Abdomen: soft, nontender Uterine Fundus: firm, appropriately tender Incision: C/D/I with honeycomb DVT Evaluation: No calf swelling or tenderness Extremities: no edema Skin: warm, dry  Results for orders placed or performed during the hospital encounter of 04/19/20 (from the past 24 hour(s))  Comprehensive metabolic panel     Status: Abnormal   Collection Time: 04/24/20  2:03 PM  Result Value Ref Range   Sodium 136 135 - 145 mmol/L   Potassium 3.7 3.5 - 5.1 mmol/L   Chloride 103 98 - 111 mmol/L   CO2 24 22 - 32 mmol/L   Glucose, Bld 117 (H) 70 - 99 mg/dL   BUN <5 (L) 6 - 20 mg/dL   Creatinine, Ser 06/24/20 0.44 - 1.00 mg/dL   Calcium 8.7 (L) 8.9 - 10.3 mg/dL   Total Protein 6.4 (L) 6.5 - 8.1 g/dL   Albumin 2.6 (L) 3.5 - 5.0 g/dL   AST 29 15 - 41 U/L   ALT 20 0 - 44 U/L   Alkaline Phosphatase 140 (H) 38 - 126 U/L   Total Bilirubin 0.9 0.3 - 1.2 mg/dL   GFR, Estimated 7.61 >95 mL/min   Anion gap 9 5 - 15  CBC     Status: Abnormal   Collection Time: 04/24/20  2:03 PM  Result Value Ref Range   WBC  12.0 (H) 4.0 - 10.5 K/uL   RBC 3.06 (L) 3.87 - 5.11 MIL/uL   Hemoglobin 9.7 (L) 12.0 - 15.0 g/dL   HCT 06/24/20 (L) 32.6 - 71.2 %   MCV 87.3 80.0 - 100.0 fL   MCH 31.7 26.0 - 34.0 pg   MCHC 36.3 (H) 30.0 - 36.0 g/dL   RDW 45.8 09.9 - 83.3 %   Platelets 211 150 - 400 K/uL   nRBC 3.7 (H) 0.0 - 0.2 %    Assessment/Plan: Amanda Burch is a 20 y.o. G1P1001 s/p PLTCS at [redacted]w[redacted]d POD#4 complicated by: 1) Preeclampsia due to headache -currently on Magnesium, plan to discontinue this afternoon -BP within normal limits, Norvasc 5mg  daily -Concern that headache may be migraine related- plan for migraine cocktail today (Benadryl, Decadron, Reglan) and will monitor closely  2) Sickle cell -pain management with oral percocet and ibuprofen S/p gabapentin  Dispo: Magnesium to discontinue this afternoon, if headache improving- may consider discharge home later today.  Meeting other postpartum milestones appropriately   LOS: 6 days   [redacted]w[redacted]d, DO Faculty Attending, Center for [facility] 04/25/2020, 10:24 AM

## 2020-04-25 NOTE — Progress Notes (Signed)
Initially HA had improved and reported good rest throughout the day.  She is now reporting 10/10 headache along with some back pain.  Reports that she feels best when lying flat, worsening pain with sitting.  Will plan to review with anesthesia for possible spinal HA and treatment.  [name], DO Attending Obstetrician & Gynecologist, [facility] for [employer], [facility]

## 2020-04-26 ENCOUNTER — Inpatient Hospital Stay (HOSPITAL_COMMUNITY): Payer: Medicaid Other | Admitting: Anesthesiology

## 2020-04-26 ENCOUNTER — Other Ambulatory Visit: Payer: Self-pay | Admitting: Obstetrics & Gynecology

## 2020-04-26 MED ORDER — OXYCODONE HCL 5 MG PO TABS: 5.0000 mg | ORAL_TABLET | Freq: Four times a day (QID) | ORAL | 0 refills | Status: DC | PRN

## 2020-04-26 MED ORDER — DOCUSATE SODIUM 100 MG PO CAPS: 100.0000 mg | ORAL_CAPSULE | Freq: Two times a day (BID) | ORAL | 0 refills | Status: DC | PRN

## 2020-04-26 MED ORDER — IBUPROFEN 800 MG PO TABS: 800.0000 mg | ORAL_TABLET | Freq: Three times a day (TID) | ORAL | 0 refills | Status: DC

## 2020-04-26 MED ORDER — AMLODIPINE BESYLATE 5 MG PO TABS: 5.0000 mg | ORAL_TABLET | Freq: Every day | ORAL | 1 refills | Status: DC

## 2020-04-26 MED FILL — oxyCODONE HCL 5 MG TABS: 5 | 5 days supply | Qty: 20 | Fill #0

## 2020-04-26 MED FILL — AMLODIPINE BESYLATE 5 MG TA: 5 | 30 days supply | Qty: 30 | Fill #0

## 2020-04-26 MED FILL — DOCUSATE SODIUM 100 MG CAPS: 100 | 15 days supply | Qty: 30 | Fill #0

## 2020-04-26 MED FILL — IBUPROFEN 800 MG TAB: 800 | 10 days supply | Qty: 30 | Fill #0

## 2020-04-26 NOTE — Progress Notes (Signed)
Pt continues to report 10/10 headache with standing up but is relieved when she lays down. Pain medication with no relief. Dr. Charlotta Newton notified and anesthesia contacted. Will continue to monitor. Carmelina Dane, RN

## 2020-04-26 NOTE — Progress Notes (Signed)
POSTPARTUM PROGRESS NOTE  POD#5  Subjective:  Amanda Burch is a 20 y.o. G1P1001 s/p PLTCS at [redacted]w[redacted]d.  Treated overnight with IV caffeine due to concern for postural headache.  Today she notes that she does think her HA is better than before.  She feels best lying down and still notes some pain now that she is sitting up.  Reports feeling like she got "hit in the back of the head."  She otherwise notes no acute complaints.  Tolerating po, passing flatus, ambulating without difficulty.  Lochia minimal.  No F/C/CP/SOB.  Objective: Blood pressure 130/65, pulse 71, temperature 98 F (36.7 C), temperature source Oral, resp. rate 17, height 5\' 4"  (1.626 m), weight 96.6 kg, last menstrual period 09/02/2019, SpO2 98 %, unknown if currently breastfeeding.  Physical Exam:  General: alert, cooperative and no distress Chest: no respiratory distress Heart:regular rate, distal pulses intact Abdomen: soft, nontender Uterine Fundus: firm, appropriately tender Incision: Honeycomb already peeling- ok for removal- incision C/D/I DVT Evaluation: No calf swelling or tenderness Extremities: no edema Skin: warm, dry  No results found for this or any previous visit (from the past 24 hour(s)).  Assessment/Plan: Amanda Burch is a 20 y.o. G1P1001 s/p PLTCS at [redacted]w[redacted]d POD#5 complicated by: 1) Preeclampsia due to headache -s/p Magnesium -BP within normal limits, Norvasc 5mg  daily -Headache- likely spinal headache- doing better this am  2) Sickle cell -pain management with oral percocet and ibuprofen S/p gabapentin  Dispo: Meeting postpartum milestones appropriately and headache improving.  Plan for discharge home today   LOS: 7 days   [redacted]w[redacted]d, DO Faculty Attending, [facility] 04/26/2020, 9:36 AM

## 2020-04-26 NOTE — Progress Notes (Signed)
CSW met with MOB in room 108.  When CSW arrived, MOB was resting in bed and appeared to be in pain.  CSW offered to return at a later time and MOB declined.  CSW reviewed NICU visitation and assessed for visiting barriers is MOB will be discharging today.  MOB denied having any barriers and psychosocial stressors.  MOB shared that she plans to room in with infant most nights.  MOB continued to report having a good support team and shared that she has all essential items to care for infant. MOB will continue to work identifying a pediatrician for infant.   MOB requested to speak with bedside RN.  CSW informed bedside of MOB's request and RN agreed to follow up with MOB.   CSW will continue to offer resources and supports to family while infant remains in NICU.    Amanda Burch, MSW, LCSW Clinical Social Work [phone]  

## 2020-04-26 NOTE — Anesthesia Procedure Notes (Signed)
Epidural Patient location during procedure: OB Start time: 04/26/2020 12:07 PM End time: 04/26/2020 12:19 PM  Staffing Anesthesiologist: Trevor Iha, MD Performed: anesthesiologist   Preanesthetic Checklist Completed: patient identified, IV checked, site marked, risks and benefits discussed, surgical consent, monitors and equipment checked, pre-op evaluation and timeout performed  Epidural Patient position: sitting Prep: DuraPrep and site prepped and draped Patient monitoring: continuous pulse ox and blood pressure Approach: midline Location: L3-L4 Injection technique: LOR air  Needle:  Needle type: Tuohy  Needle gauge: 17 G Needle length: 9 cm and 9 Needle insertion depth: 6 cm Catheter type: closed end flexible Catheter size: 19 Gauge Test dose: negative  Assessment Events: blood not aspirated, injection not painful, no injection resistance, no paresthesia and negative IV test  Additional Notes Patient identified. Risks/Benefits/Options discussed with patient including but not limited to bleeding, infection, nerve damage, paralysis, failed block, incomplete pain control, headache, blood pressure changes, nausea, vomiting, reactions to medication both or allergic, itching and postpartum back pain. Confirmed with bedside nurse the patient's most recent platelet count. Confirmed with patient that they are not currently taking any anticoagulation, have any bleeding history or any family history of bleeding disorders. Patient expressed understanding and wished to proceed. All questions were answered. Sterile technique was used throughout the entire procedure. Please see nursing notes for vital signs. Test dose was given through epidural needle and negative prior to continuing to dose epidural or start infusion. Warning signs of high block given to the patient including shortness of breath, tingling/numbness in hands, complete motor block, or any concerning symptoms with instructions  to call for help. Patient was given instructions on fall risk and not to get out of bed. All questions and concerns addressed with instructions to call with any issues.1  Attempt (S) . Patient continuously moving and screaming during procedure. When asked patient what was the matter pt  c/o headache and back pain before blood reintroduced. Epidural placement uneventful I Attempt w LOR to air at L2-3.   Marland Kitchen

## 2020-04-26 NOTE — Progress Notes (Signed)
Dr. Richardson Landry at bedside to perform blood patch. Pt did not tolerate well. Pt states that her back is a 10/10 pain. Motrin given to help alleviate pain. Will continue to monitor. Carmelina Dane RN

## 2020-04-26 NOTE — Anesthesia Preprocedure Evaluation (Signed)
Anesthesia Evaluation  Patient identified by MRN, date of birth, ID band Patient awake    Reviewed: Allergy & Precautions, NPO status , Patient's Chart, lab work & pertinent test results  Airway Mallampati: II  TM Distance: >3 FB Neck ROM: Full    Dental no notable dental hx. (+) Teeth Intact, Dental Advisory Given   Pulmonary neg pulmonary ROS,    Pulmonary exam normal breath sounds clear to auscultation       Cardiovascular Exercise Tolerance: Good hypertension (PIH), Normal cardiovascular exam Rhythm:Regular Rate:Normal     Neuro/Psych  Headaches, negative psych ROS   GI/Hepatic negative GI ROS, Neg liver ROS,   Endo/Other  negative endocrine ROS  Renal/GU negative Renal ROS     Musculoskeletal negative musculoskeletal ROS (+)   Abdominal   Peds  Hematology  (+) Sickle cell anemia ,   Anesthesia Other Findings   Reproductive/Obstetrics negative OB ROS                             Anesthesia Physical Anesthesia Plan  ASA: III  Anesthesia Plan: Epidural   Post-op Pain Management:    Induction:   PONV Risk Score and Plan: Treatment may vary due to age or medical condition  Airway Management Planned: Natural Airway  Additional Equipment:   Intra-op Plan:   Post-operative Plan:   Informed Consent: I have reviewed the patients History and Physical, chart, labs and discussed the procedure including the risks, benefits and alternatives for the proposed anesthesia with the patient or authorized representative who has indicated his/her understanding and acceptance.       Plan Discussed with: CRNA and Anesthesiologist  Anesthesia Plan Comments: (EPIDURAL blood patch performed as patient w postural HA unrelieived with conservative measures. Risk and benefits discussed with patient.Marland Kitchen )        Anesthesia Quick Evaluation

## 2020-04-26 NOTE — Plan of Care (Signed)
Pt to be discharged home with printed instructions. No concerns noted. Elain Wixon L Jmichael Gille, RN  

## 2020-04-26 NOTE — Lactation Note (Signed)
This note was copied from a baby's chart. Lactation Consultation Note  Patient Name: Amanda Burch [facility] Date: 04/26/2020 Reason for consult: Follow-up assessment;Primapara;1st time breastfeeding;Term;NICU baby Age:20 days   LC in to visit with 81 yr old P6 Mom of term baby in the NICU.  Baby originally transferred to NICU for low temps and increased work of breathing in room air.  Mom has been suffering from intense headaches.  She is feeling much better today, though while talking with LC, she grabbed her head again and sat in bed.    Mom pumped yesterday and "filled the bottle".  She was very pleased.  Encouraged consistent pumping to support a full milk supply.  Engorgement prevention and treatment reviewed.   Mom plans to pump and bottle feed rather than breastfeed.  Encouraged STS with baby as he is working on po feeding.  He took 41% by bottle yesterday and may start an ad lib trial today.  Pump parts soaking in bin.  Talked to Mom about the importance of disassembling pump parts, washing, rinsing and air drying in separate bin provided.  LC demonstrated washing parts for Mom.   Mom will obtain a [facility]BP from [facility] on discharge. Mom aware of lactation support available to her while infant is in the NICU.      Lactation Tools Discussed/Used Tools: Pump Breast pump type: Double-Electric Breast Pump Pumping frequency: 4 times so far Pumped volume: 60 mL  Interventions Interventions: Breast feeding basics reviewed;Education;Expressed milk;Breast massage;Hand express;Skin to skin;DEBP  Discharge Discharge Education: Engorgement and breast care Pump: DEBP WIC Program: Yes  Consult Status Consult Status: Follow-up Date: 04/27/20 Follow-up type: In-patient    Amanda Burch 04/26/2020, 8:42 AM

## 2020-04-30 ENCOUNTER — Encounter: Payer: Self-pay | Admitting: Obstetrics and Gynecology

## 2020-04-30 ENCOUNTER — Other Ambulatory Visit: Payer: Self-pay

## 2020-05-01 ENCOUNTER — Inpatient Hospital Stay: Payer: Self-pay | Admitting: Family Medicine

## 2020-05-02 ENCOUNTER — Other Ambulatory Visit: Payer: Self-pay

## 2020-05-02 ENCOUNTER — Ambulatory Visit (INDEPENDENT_AMBULATORY_CARE_PROVIDER_SITE_OTHER): Payer: Medicaid Other | Admitting: Obstetrics & Gynecology

## 2020-05-02 ENCOUNTER — Encounter: Payer: Self-pay | Admitting: Obstetrics & Gynecology

## 2020-05-02 MED ORDER — BISACODYL 10 MG RE SUPP: 10.0000 mg | RECTAL | 0 refills | Status: DC | PRN

## 2020-05-02 NOTE — Patient Instructions (Signed)
Cesarean Delivery, Care After This sheet gives you information about how to care for yourself after your procedure. Your health care provider may also give you more specific instructions. If you have problems or questions, contact your health care provider. What can I expect after the procedure? After the procedure, it is common to have:  A small amount of blood or clear fluid coming from the incision.  Some redness, swelling, and pain in your incision area.  Some abdominal pain and soreness.  Vaginal bleeding (lochia). Even though you did not have a vaginal delivery, you will still have vaginal bleeding and discharge.  Pelvic cramps.  Fatigue. You may have pain, swelling, and discomfort in the tissue between your vagina and your anus (perineum) if:  Your C-section was unplanned, and you were allowed to labor and push.  An incision was made in the area (episiotomy) or the tissue tore during attempted vaginal delivery. Follow these instructions at home: Incision care  Follow instructions from your health care provider about how to take care of your incision. Make sure you: ? Wash your hands with soap and water before you change your bandage (dressing). If soap and water are not available, use hand sanitizer. ? If you have a dressing, change it or remove it as told by your health care provider. ? Leave stitches (sutures), skin staples, skin glue, or adhesive strips in place. These skin closures may need to stay in place for 2 weeks or longer. If adhesive strip edges start to loosen and curl up, you may trim the loose edges. Do not remove adhesive strips completely unless your health care provider tells you to do that.  Check your incision area every day for signs of infection. Check for: ? More redness, swelling, or pain. ? More fluid or blood. ? Warmth. ? Pus or a bad smell.  Do not take baths, swim, or use a hot tub until your health care provider says it's okay. Ask your health  care provider if you can take showers.  When you cough or sneeze, hug a pillow. This helps with pain and decreases the chance of your incision opening up (dehiscing). Do this until your incision heals.   Medicines  Take over-the-counter and prescription medicines only as told by your health care provider.  If you were prescribed an antibiotic medicine, take it as told by your health care provider. Do not stop taking the antibiotic even if you start to feel better.  Do not drive or use heavy machinery while taking prescription pain medicine. Lifestyle  Do not drink alcohol. This is especially important if you are breastfeeding or taking pain medicine.  Do not use any products that contain nicotine or tobacco, such as cigarettes, e-cigarettes, and chewing tobacco. If you need help quitting, ask your health care provider. Eating and drinking  Drink at least 8 eight-ounce glasses of water every day unless told not to by your health care provider. If you breastfeed, you may need to drink even more water.  Eat high-fiber foods every day. These foods may help prevent or relieve constipation. High-fiber foods include: ? Whole grain cereals and breads. ? Brown rice. ? Beans. ? Fresh fruits and vegetables. Activity  If possible, have someone help you care for your baby and help with household activities for at least a few days after you leave the hospital.  Return to your normal activities as told by your health care provider. Ask your health care provider what activities are safe for   you.  Rest as much as possible. Try to rest or take a nap while your baby is sleeping.  Do not lift anything that is heavier than 10 lbs (4.5 kg), or the limit that you were told, until your health care provider says that it is safe.  Talk with your health care provider about when you can engage in sexual activity. This may depend on your: ? Risk of infection. ? How fast you heal. ? Comfort and desire to  engage in sexual activity.   General instructions  Do not use tampons or douches until your health care provider approves.  Wear loose, comfortable clothing and a supportive and well-fitting bra.  Keep your perineum clean and dry. Wipe from front to back when you use the toilet.  If you pass a blood clot, save it and call your health care provider to discuss. Do not flush blood clots down the toilet before you get instructions from your health care provider.  Keep all follow-up visits for you and your baby as told by your health care provider. This is important. Contact a health care provider if:  You have: ? A fever. ? Bad-smelling vaginal discharge. ? Pus or a bad smell coming from your incision. ? Difficulty or pain when urinating. ? A sudden increase or decrease in the frequency of your bowel movements. ? More redness, swelling, or pain around your incision. ? More fluid or blood coming from your incision. ? A rash. ? Nausea. ? Little or no interest in activities you used to enjoy. ? Questions about caring for yourself or your baby.  Your incision feels warm to the touch.  Your breasts turn red or become painful or hard.  You feel unusually sad or worried.  You vomit.  You pass a blood clot from your vagina.  You urinate more than usual.  You are dizzy or light-headed. Get help right away if:  You have: ? Pain that does not go away or get better with medicine. ? Chest pain. ? Difficulty breathing. ? Blurred vision or spots in your vision. ? Thoughts about hurting yourself or your baby. ? New pain in your abdomen or in one of your legs. ? A severe headache.  You faint.  You bleed from your vagina so much that you fill more than one sanitary pad in one hour. Bleeding should not be heavier than your heaviest period. Summary  After the procedure, it is common to have pain at your incision site, abdominal cramping, and slight bleeding from your vagina.  Check  your incision area every day for signs of infection.  Tell your health care provider about any unusual symptoms.  Keep all follow-up visits for you and your baby as told by your health care provider. This information is not intended to replace advice given to you by your health care provider. Make sure you discuss any questions you have with your health care provider. Document Revised: 08/19/2017 Document Reviewed: 08/19/2017 Elsevier Patient Education  2021 Elsevier Inc.  

## 2020-05-02 NOTE — Progress Notes (Unsigned)
Met with mom while here in the office for an appointment.   Patient was asking if any harm with taking Oxycodone occasionally for pain and infant breast feeding. Reviewed Oxycodone is considered safe with breast feeding and concerns would be if infant is exhibiting sleepiness. Reviewed she can take just after breast feeding to limit transfer into milk and then to infant.   Mom concerned infant lips are dry and asked if she can put anything on them. Advised not to use an oils, vaseline, or chapstick as not meant to be ingested. Can use water or breast milk on lips if needed.  Patient voiced understanding to above and to call with any questions or concerns as needed.

## 2020-05-02 NOTE — Progress Notes (Signed)
Subjective:     Amanda Burch is a 20 y.o. female who presents to the clinic 1.5 weeks status post cesarean section . Eating a regular diet without difficulty. Bowel movements are abnormal with constipation. Pain is controlled with current analgesics. Medications being used: prescription NSAID's including ibuprofen (Motrin) and narcotic analgesics including Percocet.  The following portions of the patient's history were reviewed and updated as appropriate: allergies, current medications, past family history, past medical history, past social history, past surgical history and problem list.  Review of Systems Pertinent items are noted in HPI.    Objective:    BP 127/84   Pulse 90   Wt 189 lb 1.6 oz (85.8 kg)   LMP 09/02/2019   Breastfeeding Yes   BMI 32.46 kg/m  General:  alert, cooperative and no distress  Abdomen: soft, non-tender  Incision:   healing well, no drainage, no erythema, no hernia, no seroma, no swelling, no dehiscence, incision well approximated     Assessment:    Postoperative course complicated by constipation Operative findings again reviewed. Pathology report discussed.    Plan:    1. Continue any current medications. 2. Wound care discussed. 3. Activity restrictions: no lifting more than 15 pounds 4. Anticipated return to work: not applicable. 5. Follow up: 3 weeks  6. Meds ordered this encounter  Medications  . bisacodyl (DULCOLAX) 10 MG suppository    Sig: Place 1 suppository (10 mg total) rectally as needed for moderate constipation.    Dispense:  12 suppository    Refill:  0   Adam Phenix, MD 05/02/2020

## 2020-05-08 NOTE — BH Specialist Note (Addendum)
Integrated Behavioral Health via Telemedicine Visit  05/08/2020 Amanda Burch [id number]  Number of Integrated Behavioral Health visits: 1 Session Start time: 1:25  Session End time: 1:58 Total time: 34  Referring Provider: Nettie Elm, MD Patient/Family location: Home [facility] Provider location: [facility]  All persons participating in visit: [name] and [facility] Amanda Burch   Types of Service: Individual psychotherapy and Video visit  I connected with Amanda Burch and/or Amanda Burch n/a via  Telephone or Video Enabled Telemedicine Application  (Video is Caregility application) and verified that I am speaking with the correct person using two identifiers. Discussed confidentiality: Yes   I discussed the limitations of telemedicine and the availability of in person appointments.  Discussed there is a possibility of technology failure and discussed alternative modes of communication if that failure occurs.  I discussed that engaging in this telemedicine visit, they consent to the provision of behavioral healthcare and the services will be billed under their insurance.  Patient and/or legal guardian expressed understanding and consented to Telemedicine visit: Yes   Presenting Concerns: Patient and/or family reports the following symptoms/concerns: Pt states her primary concern is wanting to go back to work due to finances, but not feeling physically ready; back pain, poor appetite(yogurt and dry cereal during day; dinner prepared by family at night) and headaches daily, stopped taking BP medicine last week(felt like was increasing headaches) sleeping when baby sleeps. Pt says BP was good with home nurse visit; does not have BP cuff at home. Duration of problem: Postpartum; Severity of problem: moderate  Patient and/or Family's Strengths/Protective Factors: Social connections and Sense of purpose  Goals  Addressed: Patient will: 1.  Reduce symptoms of: stress  2.  Increase knowledge and/or ability of: stress reduction  3.  Demonstrate ability to: Increase healthy adjustment to current life circumstances and Increase adequate support systems for patient/family  Progress towards Goals: Ongoing  Interventions: Interventions utilized:  Solution-Focused Strategies, Psychoeducation and/or Health Education and Link to [employer] Standardized Assessments completed: GAD-7 and PHQ 9  Patient and/or Family Response: Pt agrees to treatment plan  Assessment: Patient currently experiencing Psychosocial stress.   Patient may benefit from psychoeducation and brief therapeutic interventions regarding coping with symptoms of current life stress .  Plan: 1. Follow up with behavioral health clinician on : Call Amanda Burch as needed at [phone] 2. Behavioral recommendations:  -Continue taking prenatal vitamin daily, as recommended by medical provider -Continue plan to apply for EBT; use Stage manager at [employer] [facility] and additional food resources (on After Visit Summary) as needed -Place healthy snacks and water bottle in convenient location for easy access throughout the day; try to eat something at least every 5 hours during the daytime, and drinking to thirst, to decrease likelihood of headache developing -Continue sleeping when baby sleeps, especially during baby's morning naptime; use sleep sounds if helpful for mom and baby -Consider attending new mom support group at either www.[facility].com or www.postpartum.net 3. Referral(s): Integrated Art gallery manager (In Clinic) and [employer]:  new mom support  I discussed the assessment and treatment plan with the patient and/or parent/guardian. They were provided an opportunity to ask questions and all were answered. They agreed with the plan and demonstrated an understanding of the instructions.   They were advised to  call back or seek an in-person evaluation if the symptoms worsen or if the condition fails to improve as anticipated.  Valetta Close McMannes, LCSW  Depression screen [facility] 2/9 05/22/2020 04/18/2020 04/09/2020 03/30/2020 03/22/2020  Decreased Interest 0 2 1 0 1  Down, Depressed, Hopeless 1 2 2 2 2   PHQ - 2 Score 1 4 3 2 3   Altered sleeping 1 2 2  0 2  Tired, decreased energy 1 2 1 2 3   Change in appetite 3 2 1 1 1   Feeling bad or failure about yourself  0 1 2 2 2   Trouble concentrating 0 1 0 1 1  Moving slowly or fidgety/restless 0 1 0 1 0  Suicidal thoughts 0 0 0 0 0  PHQ-9 Score 6 13 9 9 12   Difficult doing work/chores - - - Not difficult at all -   GAD 7 : Generalized Anxiety Score 05/22/2020 04/18/2020 04/09/2020 03/30/2020  Nervous, Anxious, on Edge 1 2 2 2   Control/stop worrying 0 0 0 0  Worry too much - different things 1 2 2 2   Trouble relaxing 1 2 1 1   Restless 0 0 0 1  Easily annoyed or irritable 0 2 2 2   Afraid - awful might happen 1 0 1 1  Total GAD 7 Score 4 8 8  9

## 2020-05-22 ENCOUNTER — Telehealth: Payer: Self-pay | Admitting: Lactation Services

## 2020-05-22 ENCOUNTER — Ambulatory Visit (INDEPENDENT_AMBULATORY_CARE_PROVIDER_SITE_OTHER): Payer: Medicaid Other | Admitting: Clinical

## 2020-05-22 ENCOUNTER — Encounter: Payer: Self-pay | Admitting: General Practice

## 2020-05-22 DIAGNOSIS — Z658 Other specified problems related to psychosocial circumstances: Secondary | ICD-10-CM | POA: Diagnosis not present

## 2020-05-22 NOTE — Telephone Encounter (Signed)
-----   Message from Rae Lips, [place] sent at 05/22/2020  2:16 PM EDT ----- Pt has pp visit on 05/28/20, but is having headaches daily, ibuprofen has not helped, has stopped taking BP medication, as she says she thinks it made headaches worse. She says home health nurse took her BP, and it was good. I'm not sure if anything else needs to be done about this, pt does not seem concerned. She says she will accept any advice that clinical staff may have (should she come in earlier for BP check? Etc..)

## 2020-05-22 NOTE — Telephone Encounter (Signed)
Called patient following message from [facility], LCSW.    Patient reports she is having daily headaches to the back of her head. She reports they do stop sometimes. Patient is taking Ibuprofen occasionally, she is not taking all the time as she is not eating well. Tylenol does not work. She is needing to drink more. Encouraged her to drink at least 64 ounces and to eat small snacks throughout the day.   She stopped her BP med on her own, she stopped as she was concerned the Norvasc was causing the headaches, she reports the headache is better than when she was on the BP medicine.   She is having dizziness occasionally. She denies blurred vision or swelling.  Patient offered a BP check nurse visit, she would like to try to increase her food and water intake and if headaches not better, she will call for BP check nurse visit.

## 2020-05-22 NOTE — Patient Instructions (Signed)
Center for [facility] at [facility] [address] [place], [place] [address] [phone] (main office) [phone] (Cattleya Dobratz's office)  New mom support groups:  Www.conehealthybaby.com  Www.postpartum.net  /[employer] and Websites Here are a few free apps meant to help you to help yourself.  To find, try searching on the internet to see if the app is offered on Apple/Android devices. If your first choice doesn't come up on your device, the good news is that there are many choices! Play around with different apps to see which ones are helpful to you.    Calm This is an app meant to help increase calm feelings. Includes info, strategies, and tools for tracking your feelings.      Calm Harm  This app is meant to help with self-harm. Provides many 5-minute or 15-min coping strategies for doing instead of hurting yourself.       Healthy Minds Health Minds is a problem-solving tool to help deal with emotions and cope with stress you encounter wherever you are.      MindShift This app can help people cope with anxiety. Rather than trying to avoid anxiety, you can make an important shift and face it.      MY3  MY3 features a support system, safety plan and resources with the goal of offering a tool to use in a time of need.       My Life My Voice  This mood journal offers a simple solution for tracking your thoughts, feelings and moods. Animated emoticons can help identify your mood.       Relax Melodies Designed to help with sleep, on this app you can mix sounds and meditations for relaxation.      Smiling Mind Smiling Mind is meditation made easy: it's a simple tool that helps put a smile on your mind.        Stop, Breathe & Think  A friendly, simple guide for people through meditations for mindfulness and compassion.  Stop, Breathe and Think Kids Enter your current feelings and choose a "mission" to help you cope. Offers  videos for certain moods instead of just sound recordings.       Team Orange The goal of this tool is to help teens change how they think, act, and react. This app helps you focus on your own good feelings and experiences.      The United Stationers Box The United Stationers Box (VHB) contains simple tools to help patients with coping, relaxation, distraction, and positive thinking.    [facility] [employer]  Department of Social Services[place] [employer] [address] [place], [place] [address] [phone]   or  www.https://hall.info/ **SNAP/EBT/ Other nutritional benefits  [facility] [address] [place], [place], [place] [address] [phone]  or  https://king.net/ **WIC for  women who are pregnant and postpartum, infants and children up to 34 years old  [employer] [facility] [address] [place], [place] [address] [phone]   or   www.theblessedtable.org  **Food pantry  [employer] [address] [place], [place], [place] [address] [phone]   or   https://brotherkolbes.godaddysites.com  **Emergency food and prepared meals  [address] [place] [employer] [facility] [address] [place], [place] [address] [phone]   or   www.cedargrovetop.us **Food pantry  [facility] [address], [place], [place] [address] [phone]   or   www.GolfingFamily.no **Food pantry  [facility] [facility] [address], [place], [place] [address] [phone] **Food pantry  [facility] [address], [place], [place] [address] [phone]   or   www.greensborourbanministry.org  Tour manager and prepared meals  [facility] [address] [place], [address], [place], [place]  [address] VerifiedMovies.gl  **Food pantry  [employer] [facility] [address], [place], [place] [address] [phone]   or   www.lbcnow.org  **Food pantry  [employer] [address] [place], [place] [address] [phone]   or   WorkingMBA.co.nz **Food pantry, nutrition education, gardening activities  [facility] [address] [place], [place] [address] [phone] **Food pantry  [facility] [place] [address] [place], [place] [address] [phone]   or   www.salvationarmyof[place] [facility] [place] [address] [place], [place] [address] [phone]   or   http://senior-resources-[place].org [employer] Food on Wheels Program  [facility] [address] [place], [place] [address] [phone]   or   www.stmattchurch.com  **Food pantry  [facility] [address] [place], [place] [address] [phone]   or   vandaliapresbyterianchurch.org **Food pantry

## 2020-05-28 ENCOUNTER — Ambulatory Visit: Payer: Self-pay | Admitting: Obstetrics & Gynecology

## 2020-06-04 ENCOUNTER — Encounter: Payer: Self-pay | Admitting: Obstetrics & Gynecology

## 2020-06-04 ENCOUNTER — Other Ambulatory Visit (HOSPITAL_COMMUNITY)
Admission: RE | Admit: 2020-06-04 | Discharge: 2020-06-04 | Disposition: A | Discharge status: Home / Self Care | Payer: Medicaid Other | Source: Ambulatory Visit | Attending: Obstetrics & Gynecology | Admitting: Obstetrics & Gynecology

## 2020-06-04 ENCOUNTER — Ambulatory Visit (INDEPENDENT_AMBULATORY_CARE_PROVIDER_SITE_OTHER): Payer: Medicaid Other | Admitting: Obstetrics & Gynecology

## 2020-06-04 DIAGNOSIS — B3731 Acute candidiasis of vulva and vagina: Secondary | ICD-10-CM

## 2020-06-04 DIAGNOSIS — N898 Other specified noninflammatory disorders of vagina: Secondary | ICD-10-CM | POA: Diagnosis not present

## 2020-06-04 DIAGNOSIS — D5700 Hb-SS disease with crisis, unspecified: Secondary | ICD-10-CM

## 2020-06-04 DIAGNOSIS — O1414 Severe pre-eclampsia complicating childbirth: Secondary | ICD-10-CM | POA: Diagnosis not present

## 2020-06-04 DIAGNOSIS — A5901 Trichomonal vulvovaginitis: Secondary | ICD-10-CM

## 2020-06-04 DIAGNOSIS — B373 Candidiasis of vulva and vagina: Secondary | ICD-10-CM

## 2020-06-04 NOTE — Progress Notes (Signed)
Post Partum Visit Note  Amanda Burch is a 20 y.o. G72P1001 female who presents for a postpartum visit. She is 6 weeks postpartum following a primary cesarean section for failure to progress.  I have fully reviewed the prenatal and intrapartum course. The delivery was at 39 gestational weeks.  Anesthesia: spinal. Postpartum course has been complicated by sickle cell crisis and severe preeclampsia, which necessitated longer stay in hospital. Was discharged on Norvasc, but she stopped taking this due to headaches. Baby is doing well. Baby is feeding by both breast and bottle - Gerber Gentle . Bleeding staining only, had menstrual period one week ago. Bowel function is normal. Bladder function is normal. Patient is not sexually active. Contraception method is none. Postpartum depression screening: negative but she endorses she is depressed and wants to see the [facility] clinician again, already saw her two weeks ago.  Patient also reports vaginal itching and discomfort.   The pregnancy intention screening data noted above was reviewed. Potential methods of contraception were discussed. The patient elected to proceed with Female Condom.    Edinburgh Postnatal Depression Scale - 06/04/20 1632      Edinburgh Postnatal Depression Scale:  In the Past 7 Days   I have been able to laugh and see the funny side of things. 0    I have looked forward with enjoyment to things. 0    I have blamed myself unnecessarily when things went wrong. 2    I have been anxious or worried for no good reason. 1    I have felt scared or panicky for no good reason. 1    Things have been getting on top of me. 1    I have been so unhappy that I have had difficulty sleeping. 0    I have felt sad or miserable. 2    I have been so unhappy that I have been crying. 0    The thought of harming myself has occurred to me. 0    Edinburgh Postnatal Depression Scale Total 7          The following portions of the patient's history were  reviewed and updated as appropriate: allergies, current medications, past family history, past medical history, past social history, past surgical history and problem list.  Review of Systems Pertinent items noted in HPI and remainder of comprehensive ROS otherwise negative.  Objective:  BP 115/67   Pulse 87   Ht 5\' 7"  (1.702 m)   Wt 186 lb 9.6 oz (84.6 kg)   LMP 09/02/2019   BMI 29.23 kg/m    General:  alert and no distress   Breasts:  inspection negative, no nipple discharge or bleeding, no masses or nodularity palpable  Lungs: clear to auscultation bilaterally  Heart:  regular rate and rhythm  Abdomen: soft, non-tender; bowel sounds normal; no masses,  no organomegaly. Incision C/D/I, no erythema, no induration, no drainage, healing well        Assessment:   Normal postpartum exam.   Plan:   Essential components of care per ACOG recommendations:  1.  Mood and well being: Patient with negative depression screening but desires to see [facility] clinician, this will be arranged - Patient does not use tobacco or drugs.  2. Infant care and feeding:  -Patient currently breastmilk feeding? Yes . Reviewed importance of draining breast regularly to support lactation. -Social determinants of health (SDOH) reviewed in EPIC. No concerns  3. Sexuality, contraception and birth spacing - Patient does not  want a pregnancy in the next year.  Desired family size is 2 children.  - Reviewed forms of contraception in tiered fashion. Patient desired condoms, natural family planning (NFP) today.   Considering Depo Provera. - Discussed birth spacing of 18 months  4. Sleep and fatigue -Encouraged family/partner/community support of 4 hrs of uninterrupted sleep to help with mood and fatigue  5. Physical Recovery  - Discussed patients delivery - Patient has urinary incontinence? No - Patient is safe to resume physical and sexual activity  6.  Sickle Cell Disease -  Referred to [facility]  7. Severe  preeclampsia - BP stable, no need for meds  8. Vaginal discharge and irritation - Cervicovaginal ancillary testing done as self-swab, will follow up results and manage accordingly.    Jaynie Collins, MD Center for [employer], [facility]

## 2020-06-05 ENCOUNTER — Telehealth: Payer: Self-pay

## 2020-06-05 DIAGNOSIS — A5901 Trichomonal vulvovaginitis: Secondary | ICD-10-CM | POA: Insufficient documentation

## 2020-06-05 LAB — CERVICOVAGINAL ANCILLARY ONLY
Bacterial Vaginitis (gardnerella): NEGATIVE
Candida Glabrata: NEGATIVE
Candida Vaginitis: POSITIVE — AB
Chlamydia: NEGATIVE
Comment: NEGATIVE
Comment: NEGATIVE
Comment: NEGATIVE
Comment: NEGATIVE
Comment: NEGATIVE
Comment: NORMAL
Neisseria Gonorrhea: NEGATIVE
Trichomonas: POSITIVE — AB

## 2020-06-05 MED ORDER — FLUCONAZOLE 150 MG PO TABS: 150.0000 mg | ORAL_TABLET | Freq: Once | ORAL | 3 refills | Status: AC

## 2020-06-05 MED ORDER — METRONIDAZOLE 500 MG PO TABS: 500.0000 mg | ORAL_TABLET | Freq: Two times a day (BID) | ORAL | 1 refills | Status: AC

## 2020-06-05 NOTE — Telephone Encounter (Signed)
-----   Message from Tereso Newcomer, MD sent at 06/05/2020  3:07 PM EDT ----- Regarding: Sorry if this is duplicate Unsure if I sent this when I created the result note:   Patient has trichomonal vaginitis and yeast vaginitis. Recommend testing for other STIs, also needs to let partner(s) know so the partner(s) can get testing and treatment. Patient and sex partner(s) should abstain from unprotected sexual activity for seven days after everyone receives appropriate treatment. Metronidazole and Diflucan prescribed for patient. Patient will need to return in about 4 weeks after treatment for repeat test of cure. Please call to inform patient of results and recommendations, and advise to pick up prescription and take as directed. Please advise patient to practice safe sex at all times.  Jaynie Collins, MD

## 2020-06-05 NOTE — Telephone Encounter (Signed)
ERROR

## 2020-06-05 NOTE — BH Specialist Note (Signed)
error 

## 2020-06-05 NOTE — Addendum Note (Signed)
Addended by: Jaynie Collins A on: 06/05/2020 03:07 PM   Modules accepted: Orders

## 2020-06-05 NOTE — Telephone Encounter (Signed)
Called Pt to see if she had already been notified aboiut positive test results for Trich & Yeast. Pt states did get message thru My Chart. Stressed with Pt to make sure Partner gets tested & treated & no sex for 7 days after last person is treated. Pt verbalized understanding

## 2020-06-05 NOTE — Progress Notes (Signed)
Patient has trichomonal vaginitis and yeast vaginitis.  Recommend testing for other STIs, also needs to let partner(s) know so the partner(s) can get testing and treatment. Patient and sex partner(s) should abstain from unprotected sexual activity for seven days after everyone receives appropriate treatment.  Metronidazole and Diflucan prescribed for patient.  Patient will need to return in about 4 weeks after treatment for repeat test of cure.  Please call to inform patient of results and recommendations, and advise to pick up prescription and take as directed.  Please advise patient to practice safe sex at all times.  Jaynie Collins, MD

## 2020-06-07 ENCOUNTER — Ambulatory Visit: Payer: Medicaid Other | Admitting: Clinical

## 2020-06-07 ENCOUNTER — Other Ambulatory Visit: Payer: Self-pay

## 2020-06-07 ENCOUNTER — Other Ambulatory Visit: Payer: Medicaid Other

## 2020-06-07 DIAGNOSIS — Z7689 Persons encountering health services in other specified circumstances: Secondary | ICD-10-CM

## 2020-06-08 LAB — HEPATITIS B SURFACE ANTIGEN: Hepatitis B Surface Ag: NEGATIVE

## 2020-06-08 LAB — RPR: RPR Ser Ql: NONREACTIVE

## 2020-06-08 LAB — HEPATITIS C ANTIBODY: Hep C Virus Ab: <0.1 s/co ratio (ref 0.0–0.9)

## 2020-06-08 LAB — HIV ANTIBODY (ROUTINE TESTING W REFLEX): HIV Screen 4th Generation wRfx: NONREACTIVE

## 2020-07-24 ENCOUNTER — Telehealth: Payer: Self-pay | Admitting: Family Medicine

## 2020-07-24 ENCOUNTER — Ambulatory Visit: Payer: Self-pay | Admitting: Family Medicine

## 2020-07-24 NOTE — Telephone Encounter (Signed)
Pt was called VM was left informing Pt that her appointment has been cancelled and will call the reschedule

## 2020-07-30 ENCOUNTER — Encounter: Payer: Self-pay | Admitting: Obstetrics and Gynecology

## 2020-07-30 ENCOUNTER — Other Ambulatory Visit: Payer: Self-pay

## 2020-07-30 ENCOUNTER — Ambulatory Visit (INDEPENDENT_AMBULATORY_CARE_PROVIDER_SITE_OTHER): Payer: Medicaid Other

## 2020-07-30 ENCOUNTER — Other Ambulatory Visit (HOSPITAL_COMMUNITY)
Admission: RE | Admit: 2020-07-30 | Discharge: 2020-07-30 | Disposition: A | Discharge status: Home / Self Care | Payer: Medicaid Other | Source: Ambulatory Visit | Attending: Obstetrics and Gynecology | Admitting: Obstetrics and Gynecology

## 2020-07-30 VITALS — BP 105/93 | HR 74 | Ht 67.5 in | Wt 186.7 lb

## 2020-07-30 DIAGNOSIS — A599 Trichomoniasis, unspecified: Secondary | ICD-10-CM | POA: Diagnosis not present

## 2020-07-30 NOTE — Progress Notes (Signed)
Pt here for TOC for Trich Nurse visit, advised result should show in My Chart in a couple of days. Pt verbalized understanding.

## 2020-07-31 LAB — CERVICOVAGINAL ANCILLARY ONLY
Comment: NEGATIVE
Trichomonas: NEGATIVE

## 2020-07-31 NOTE — Progress Notes (Signed)
Patient was assessed and managed by nursing staff during this encounter. I have reviewed the chart and agree with the documentation and plan. I have also made any necessary editorial changes.  Catalina Antigua, MD 07/31/2020 9:06 AM

## 2020-08-14 ENCOUNTER — Encounter (HOSPITAL_COMMUNITY): Payer: Self-pay

## 2020-08-14 ENCOUNTER — Other Ambulatory Visit: Payer: Self-pay

## 2020-08-14 ENCOUNTER — Ambulatory Visit (HOSPITAL_COMMUNITY)
Admission: EM | Admit: 2020-08-14 | Discharge: 2020-08-14 | Disposition: A | Discharge status: Home / Self Care | Payer: Medicaid Other | Attending: Internal Medicine | Admitting: Internal Medicine

## 2020-08-14 DIAGNOSIS — M7918 Myalgia, other site: Secondary | ICD-10-CM | POA: Diagnosis not present

## 2020-08-14 DIAGNOSIS — M940 Chondrocostal junction syndrome [Tietze]: Secondary | ICD-10-CM

## 2020-08-14 MED ORDER — TIZANIDINE HCL 4 MG PO TABS: 4.0000 mg | ORAL_TABLET | Freq: Four times a day (QID) | ORAL | 0 refills | Status: DC | PRN

## 2020-08-14 MED ORDER — IBUPROFEN 600 MG PO TABS: 600.0000 mg | ORAL_TABLET | Freq: Four times a day (QID) | ORAL | 0 refills | Status: DC | PRN

## 2020-08-14 NOTE — Discharge Instructions (Addendum)
Take the Ibuprofen three times a day for pain.  You can also take Tylenol as needed for pain relief and fever reduction.    Take the Zanaflex as needed for muscle pain and spasms.  It can make you sleepy so do not take it prior to driving.  Do not breastfed while taking Zanaflex.   Make sure you are drinking plenty of fluids, especially water, and rest as much as possible.    Return or go to the Emergency Department if symptoms worsen or do not improve in the next few days.

## 2020-08-14 NOTE — ED Provider Notes (Signed)
[facility]    CSN: [id number] Arrival date & time: 08/14/20  1658      History   Chief Complaint Chief Complaint  Patient presents with   Chest Pain   back pain/pressure   Arm Pain    HPI Amanda Burch is a 20 y.o. female.   Patient here for evaluation of chest pressure, lower back pain, and bilateral arm pain that has been ongoing for the past several weeks.  Reports symptoms initially resolved but then returned approximately 5 days ago and have not improved.  Reports pain in chest feels like pressure.  Reports pain is different than sickle cell crisis.  Denies any trauma, injury, or other precipitating event.  Denies any specific alleviating or aggravating factors.  Denies any fevers, chest pain, shortness of breath, N/V/D, numbness, tingling, weakness, abdominal pain, or headaches.     The history is provided by the patient.  Chest Pain Associated symptoms: back pain   Associated symptoms: no cough, no palpitations and no shortness of breath   Arm Pain Associated symptoms include chest pain. Pertinent negatives include no shortness of breath.   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 Authorizing Provider  docusate sodium (COLACE) 100 MG capsule Take 1 capsule (100 mg total) by mouth 2 (two) times daily as needed for mild constipation. 04/26/20  Yes [name], DO  folic acid (FOLVITE) 1 MG tablet Take 1 tablet (1 mg total) by mouth daily. 04/24/20 04/24/21 Yes Massie Maroon, FNP  ibuprofen (ADVIL) 600 MG tablet Take 1 tablet (600 mg total) by mouth every 6 (six) hours as needed. 08/14/20  Yes Ivette Loyal, NP  tiZANidine (ZANAFLEX) 4 MG tablet Take 1 tablet (4 mg total) by mouth every 6 (six) hours as needed for muscle spasms. 08/14/20  Yes Ivette Loyal, NP  amLODipine (NORVASC) 5 MG tablet Take 1 tablet (5 mg total) by mouth daily. Patient not taking: Reported on 06/04/2020 04/26/20   [name], DO  Prenatal Vit-Fe Fumarate-FA (PREPLUS) 27-1 MG TABS Take 1 tablet by mouth daily. Patient not taking: Reported on 07/30/2020 03/22/20   Warden Fillers, MD    Family History Family History  Adopted: Yes  Problem Relation Age of Onset   Asthma Mother    Sarcoidosis Mother    Deep vein thrombosis Mother  Asthma Father     Social History Social History   Tobacco Use   Smoking status: Never   Smokeless tobacco: Never  Vaping Use   Vaping Use: Never used  Substance Use Topics   Alcohol use: No   Drug use: No     Allergies   Phenergan [promethazine] and Stadol [butorphanol]   Review of Systems Review of Systems  Respiratory:  Positive for chest tightness. Negative for cough and shortness of breath.   Cardiovascular:  Positive for chest pain. Negative for palpitations and leg swelling.  Musculoskeletal:  Positive for arthralgias, back pain and myalgias.  All other systems reviewed and are negative.   Physical Exam Triage Vital Signs ED Triage Vitals  Enc Vitals Group     BP 08/14/20 1740 112/61     Pulse Rate 08/14/20 1740 80     Resp 08/14/20 1740 18     Temp 08/14/20 1740 98.9  F (37.2 C)     Temp Source 08/14/20 1740 Oral     SpO2 08/14/20 1740 99 %     Weight --      Height --      Head Circumference --      Peak Flow --      Pain Score 08/14/20 1738 6     Pain Loc --      Pain Edu? --      Excl. in GC? --    No data found.  Updated Vital Signs BP 112/61 (BP Location: Right Arm)   Pulse 80   Temp 98.9 F (37.2 C) (Oral)   Resp 18   LMP 07/23/2020 (Exact Date)   SpO2 99%   Visual Acuity Right Eye Distance:   Left Eye Distance:   Bilateral Distance:    Right Eye Near:   Left Eye Near:    Bilateral Near:     Physical Exam Vitals and nursing note reviewed.  Constitutional:      General: She is not in acute distress.    Appearance: Normal appearance. She is not ill-appearing, toxic-appearing or diaphoretic.  HENT:     Head: Normocephalic and atraumatic.  Eyes:     Conjunctiva/sclera: Conjunctivae normal.  Cardiovascular:     Rate and Rhythm: Normal rate and regular rhythm.     Pulses: Normal pulses.          Carotid pulses are 2+ on the right side and 2+ on the left side.      Radial pulses are 2+ on the right side and 2+ on the left side.     Heart sounds: Normal heart sounds.  Pulmonary:     Effort: Pulmonary effort is normal.     Breath sounds: Normal breath sounds.  Chest:     Chest wall: Tenderness (reproducible chest wall pain) present. No mass, deformity, crepitus or edema. There is no dullness to percussion.  Abdominal:     General: Abdomen is flat.  Musculoskeletal:        General: Normal range of motion.     Right shoulder: Normal. No tenderness, bony tenderness or crepitus. Normal range of motion. Normal strength. Normal pulse.     Left shoulder: Normal. No tenderness, bony tenderness or crepitus. Normal range of motion. Normal strength. Normal pulse.     Cervical back: Normal, normal range of motion and neck supple. No tenderness or bony tenderness.     Thoracic back: Normal. No tenderness or bony tenderness.     Lumbar  back: Normal. No  tenderness or bony tenderness. Negative right straight leg raise test and negative left straight leg raise test.  Skin:    General: Skin is warm and dry.  Neurological:     General: No focal deficit present.     Mental Status: She is alert and oriented to person, place, and time.  Psychiatric:        Mood and Affect: Mood normal.     UC Treatments / Results  Labs (all labs ordered are listed, but only abnormal results are displayed) Labs Reviewed - No data to display  EKG   Radiology No results found.  Procedures Procedures (including critical care time)  Medications Ordered in UC Medications - No data to display  Initial Impression / Assessment and Plan / UC Course  I have reviewed the triage vital signs and the nursing notes.  Pertinent labs & imaging results that were available during my care of the patient were reviewed by me and considered in my medical decision making (see chart for details).    Assessment negative for red flags or concerns.  Likely costchondritis and musculoskeletal pain.  Will treat with Ibuprofen 600mg  three times a day as needed for pain and Zanaflex as needed for muscle pain and spasms.  Patient recently had a baby but states that she is not breastfeeding, but she was instructed to not breast feed while taking the Ibuprofen and/or Zanaflex.  Also instructed patient that Zanaflex can cause drowsiness so she should not take it prior to driving.  Encouraged fluids and rest.  Follow up or go to the emergency department for any worsening symptoms.  Follow up with primary care as needed.  Final Clinical Impressions(s) / UC Diagnoses   Final diagnoses:  Costochondritis  Musculoskeletal pain     Discharge Instructions      Take the Ibuprofen three times a day for pain.  You can also take Tylenol as needed for pain relief and fever reduction.    Take the Zanaflex as needed for muscle pain and spasms.  It can make you sleepy so do not  take it prior to driving.  Do not breastfed while taking Zanaflex.   Make sure you are drinking plenty of fluids, especially water, and rest as much as possible.    Return or go to the Emergency Department if symptoms worsen or do not improve in the next few days.      ED Prescriptions     Medication Sig Dispense Auth. Provider   tiZANidine (ZANAFLEX) 4 MG tablet Take 1 tablet (4 mg total) by mouth every 6 (six) hours as needed for muscle spasms. 30 tablet R, NP   ibuprofen (ADVIL) 600 MG tablet Take 1 tablet (600 mg total) by mouth every 6 (six) hours as needed. 30 tablet Chales Salmon, NP      PDMP not reviewed this encounter.   Ivette Loyal, NP 08/14/20 [phone]

## 2020-08-14 NOTE — ED Triage Notes (Signed)
Pt c/o chest pain and pressure on left side starting 2 weeks ago with pain/pressure sensation in her back and left arm. States this went away then came back about 5 days ago and intermittently feels worse.

## 2020-09-02 ENCOUNTER — Encounter (HOSPITAL_COMMUNITY): Payer: Self-pay

## 2020-09-02 ENCOUNTER — Other Ambulatory Visit: Payer: Self-pay

## 2020-09-02 ENCOUNTER — Ambulatory Visit (HOSPITAL_COMMUNITY)
Admission: EM | Admit: 2020-09-02 | Discharge: 2020-09-02 | Disposition: A | Discharge status: Home / Self Care | Payer: Medicaid Other | Attending: Emergency Medicine | Admitting: Emergency Medicine

## 2020-09-02 DIAGNOSIS — J069 Acute upper respiratory infection, unspecified: Secondary | ICD-10-CM

## 2020-09-02 LAB — POC INFLUENZA A AND B ANTIGEN (URGENT CARE ONLY)
INFLUENZA A ANTIGEN, POC: NEGATIVE
INFLUENZA B ANTIGEN, POC: NEGATIVE

## 2020-09-02 LAB — POCT RAPID STREP A, ED / UC: Streptococcus, Group A Screen (Direct): NEGATIVE

## 2020-09-02 MED ORDER — GUAIFENESIN ER 600 MG PO TB12: 600.0000 mg | ORAL_TABLET | Freq: Two times a day (BID) | ORAL | 0 refills | Status: DC

## 2020-09-02 MED ORDER — LIDOCAINE VISCOUS HCL 2 % MT SOLN: 15.0000 mL | OROMUCOSAL | 0 refills | Status: DC | PRN

## 2020-09-02 NOTE — ED Triage Notes (Addendum)
Pt reports severe pain in throat for several days. Negative home covid test. Reports runny/burning nose. Reports history of sickle cell disease. Pt also c/o headaches.

## 2020-09-02 NOTE — ED Provider Notes (Signed)
[facility]    CSN: [id number] Arrival date & time: 09/02/20  1325      History   Chief Complaint Chief Complaint  Patient presents with   Sore Throat   Nasal Congestion   Headache    HPI Amanda Burch is a 20 y.o. female.   Patient presents with sore throat and nasal congestion with postnasal drip for 4 days.  Attests to having chills and body aches but has resolved.  No fevers but has felt hot.  Unable to tolerate food or liquids, has only been eating applesauce for meals.  Denies headaches, ear pain or fullness, chest pain, shortness of breath, wheezing, cough.  No known sick contacts.  Using over-the-counter cold and flu pill and Tylenol with no relief.  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 Authorizing Provider  guaiFENesin (MUCINEX) 600 MG 12 hr tablet Take 1 tablet (600 mg total) by mouth 2 (two) times daily. 09/02/20  Yes Monserrate Blaschke R, NP  ibuprofen (ADVIL) 600 MG tablet Take 1 tablet (600 mg total) by mouth every 6 (six) hours as needed. 08/14/20  Yes Ivette Loyal, NP  lidocaine (XYLOCAINE) 2 % solution Use as  directed 15 mLs in the mouth or throat as needed for mouth pain. 09/02/20  Yes Gunner Iodice R, NP  amLODipine (NORVASC) 5 MG tablet Take 1 tablet (5 mg total) by mouth daily. Patient not taking: Reported on 06/04/2020 04/26/20   [name], DO  docusate sodium (COLACE) 100 MG capsule Take 1 capsule (100 mg total) by mouth 2 (two) times daily as needed for mild constipation. 04/26/20   [name], DO  folic acid (FOLVITE) 1 MG tablet Take 1 tablet (1 mg total) by mouth daily. 04/24/20 04/24/21  Massie Maroon, FNP  Prenatal Vit-Fe Fumarate-FA (PREPLUS) 27-1 MG TABS Take 1 tablet by mouth daily. Patient not taking: Reported on 07/30/2020 03/22/20   Warden Fillers, MD  tiZANidine (ZANAFLEX) 4 MG tablet Take 1 tablet (4 mg total) by mouth every 6 (six) hours as needed for muscle spasms. 08/14/20   Ivette Loyal, NP    Family History Family History  Adopted: Yes  Problem Relation Age of Onset   Asthma Mother    Sarcoidosis Mother    Deep vein thrombosis Mother    Asthma Father  Social History Social History   Tobacco Use   Smoking status: Never   Smokeless tobacco: Never  Vaping Use   Vaping Use: Never used  Substance Use Topics   Alcohol use: No   Drug use: No     Allergies   Phenergan [promethazine] and Stadol [butorphanol]   Review of Systems Review of Systems  Constitutional: Negative.   HENT:  Positive for congestion, postnasal drip and sore throat. Negative for dental problem, drooling, ear discharge, ear pain, facial swelling, hearing loss, mouth sores, nosebleeds, rhinorrhea, sinus pressure, sinus pain, sneezing, tinnitus, trouble swallowing and voice change.   Respiratory: Negative.    Cardiovascular: Negative.   Skin: Negative.   Neurological: Negative.     Physical Exam Triage Vital Signs ED Triage Vitals  Enc Vitals Group     BP 09/02/20 1415 115/68     Pulse Rate 09/02/20 1415 100     Resp 09/02/20 1415 18     Temp 09/02/20 1415 98.9 F (37.2 C)      Temp Source 09/02/20 1415 Oral     SpO2 09/02/20 1415 100 %     Weight --      Height --      Head Circumference --      Peak Flow --      Pain Score 09/02/20 1411 9     Pain Loc --      Pain Edu? --      Excl. in GC? --    No data found.  Updated Vital Signs BP 115/68 (BP Location: Left Arm)   Pulse 100   Temp 98.9 F (37.2 C) (Oral)   Resp 18   LMP 08/21/2020 (Approximate)   SpO2 100%   Visual Acuity Right Eye Distance:   Left Eye Distance:   Bilateral Distance:    Right Eye Near:   Left Eye Near:    Bilateral Near:     Physical Exam Constitutional:      Appearance: She is well-developed and normal weight.  HENT:     Head: Normocephalic.     Right Ear: Tympanic membrane and ear canal normal.     Left Ear: Tympanic membrane and ear canal normal.     Nose: Congestion present. No rhinorrhea.     Mouth/Throat:     Mouth: Mucous membranes are moist.     Pharynx: Posterior oropharyngeal erythema present. No oropharyngeal exudate.     Tonsils: No tonsillar exudate or tonsillar abscesses. 0 on the right. 0 on the left.  Eyes:     Conjunctiva/sclera: Conjunctivae normal.     Pupils: Pupils are equal, round, and reactive to light.  Cardiovascular:     Rate and Rhythm: Normal rate and regular rhythm.     Heart sounds: Normal heart sounds.  Pulmonary:     Effort: Pulmonary effort is normal.  Musculoskeletal:     Cervical back: Normal range of motion.  Lymphadenopathy:     Cervical: Cervical adenopathy present.  Skin:    General: Skin is warm.  Neurological:     Mental Status: She is oriented to person, place, and time.  Psychiatric:        Mood and Affect: Mood normal.        Behavior: Behavior normal.     UC Treatments / Results  Labs (all labs ordered are listed, but only abnormal results are displayed) Labs Reviewed  POCT RAPID STREP A, ED / UC  POC INFLUENZA A AND B ANTIGEN (URGENT CARE  ONLY)    EKG   Radiology No results  found.  Procedures Procedures (including critical care time)  Medications Ordered in UC Medications - No data to display  Initial Impression / Assessment and Plan / UC Course  I have reviewed the triage vital signs and the nursing notes.  Pertinent labs & imaging results that were available during my care of the patient were reviewed by me and considered in my medical decision making (see chart for details).  Viral URI  1.  Strep test negative 2.  Flu test pending  3.  Viscous lidocaine given 15-minute mL every 4 hours as needed for comfort 4.  Mucinex 600 mg twice daily as needed 5.  Salt water gargles hot and warm liquids, throat lozenges for comfort 6.  Advised patient to attempt to eat more than applesauce and to increase fluid intake if unable to eat to prevent dehydration Final diagnoses:  Viral URI     Discharge Instructions      Your strep test today was negative  Flu Test is pending you will be called if positive  Can gargle 15 mL of lidocaine solution every 4 hours as needed  Can continue salt water gargles, hot liquids and soft foods and throat lozenges for comfort  Can take 600 mg of ibuprofen with a small amount of food every 6 hours as needed for pain  Can use Mucinex twice a day to help with congestion  Can try any other over-the-counter medications to help provide to comfort    ED Prescriptions     Medication Sig Dispense Auth. Provider   lidocaine (XYLOCAINE) 2 % solution Use as directed 15 mLs in the mouth or throat as needed for mouth pain. 100 mL Presli Fanguy R, NP   guaiFENesin (MUCINEX) 600 MG 12 hr tablet Take 1 tablet (600 mg total) by mouth 2 (two) times daily. 30 tablet Valinda Hoar, NP      PDMP not reviewed this encounter.   Valinda Hoar, NP 09/02/20 1459

## 2020-09-02 NOTE — Discharge Instructions (Addendum)
Your strep test today was negative  Flu Test is pending you will be called if positive  Can gargle 15 mL of lidocaine solution every 4 hours as needed  Can continue salt water gargles, hot liquids and soft foods and throat lozenges for comfort  Can take 600 mg of ibuprofen with a small amount of food every 6 hours as needed for pain  Can use Mucinex twice a day to help with congestion  Can try any other over-the-counter medications to help provide to comfort

## 2020-09-05 LAB — CULTURE, GROUP A STREP (THRC)

## 2020-09-11 ENCOUNTER — Ambulatory Visit: Payer: Self-pay | Admitting: Family Medicine

## 2020-10-19 ENCOUNTER — Other Ambulatory Visit (HOSPITAL_COMMUNITY)
Admission: RE | Admit: 2020-10-19 | Discharge: 2020-10-19 | Disposition: A | Discharge status: Home / Self Care | Payer: Medicaid Other | Source: Ambulatory Visit | Attending: Family Medicine | Admitting: Family Medicine

## 2020-10-19 ENCOUNTER — Other Ambulatory Visit: Payer: Self-pay

## 2020-10-19 ENCOUNTER — Ambulatory Visit (INDEPENDENT_AMBULATORY_CARE_PROVIDER_SITE_OTHER): Payer: Medicaid Other

## 2020-10-19 VITALS — BP 115/78 | HR 75 | Ht 67.0 in | Wt 187.5 lb

## 2020-10-19 DIAGNOSIS — R350 Frequency of micturition: Secondary | ICD-10-CM | POA: Diagnosis not present

## 2020-10-19 DIAGNOSIS — N898 Other specified noninflammatory disorders of vagina: Secondary | ICD-10-CM | POA: Diagnosis not present

## 2020-10-19 DIAGNOSIS — M545 Low back pain, unspecified: Secondary | ICD-10-CM | POA: Diagnosis not present

## 2020-10-19 LAB — POCT URINALYSIS DIP (DEVICE)
Bilirubin Urine: NEGATIVE
Glucose, UA: NEGATIVE mg/dL
Ketones, ur: NEGATIVE mg/dL
Leukocytes,Ua: NEGATIVE
Nitrite: NEGATIVE
Protein, ur: NEGATIVE mg/dL
Specific Gravity, Urine: 1.015 (ref 1.005–1.030)
Urobilinogen, UA: 1 mg/dL (ref 0.0–1.0)
pH: 5.5 (ref 5.0–8.0)

## 2020-10-19 NOTE — Progress Notes (Signed)
Pt here today for c/o urinary frequency and low back pain. Denies all other urinary symptoms. Pt also states having vaginal irrtation as well. All of these symptoms have been going on for 1-2 weeks. Pt has not taken any medications or had any treatment thus far. Pt has hx of UTI and Trich.  UA in office today, only moderate blood. Denies hx of kidney stones. Will send urine for culture. Pt verbalized understanding.  Pt also advised will do self swab. Self swab collected today. Pt advised results will take 24-48 hours and will see results in mychart and will be notified if needs further treatment. Pt verbalized understanding and agreeable to plan of care.   Judeth Cornfield, RN

## 2020-10-21 LAB — URINE CULTURE

## 2020-10-22 LAB — CERVICOVAGINAL ANCILLARY ONLY
Bacterial Vaginitis (gardnerella): POSITIVE — AB
Candida Glabrata: NEGATIVE
Candida Vaginitis: NEGATIVE
Chlamydia: NEGATIVE
Comment: NEGATIVE
Comment: NEGATIVE
Comment: NEGATIVE
Comment: NEGATIVE
Comment: NEGATIVE
Comment: NORMAL
Neisseria Gonorrhea: NEGATIVE
Trichomonas: NEGATIVE

## 2020-10-24 ENCOUNTER — Other Ambulatory Visit: Payer: Self-pay | Admitting: Family Medicine

## 2020-10-24 MED ORDER — METRONIDAZOLE 500 MG PO TABS: 500.0000 mg | ORAL_TABLET | Freq: Two times a day (BID) | ORAL | 0 refills | Status: AC

## 2020-10-24 NOTE — Progress Notes (Signed)
Flagyl sent to pharmacy for symptomatic BV

## 2021-03-04 ENCOUNTER — Other Ambulatory Visit: Payer: Self-pay

## 2021-03-04 ENCOUNTER — Other Ambulatory Visit (HOSPITAL_COMMUNITY)
Admission: RE | Admit: 2021-03-04 | Discharge: 2021-03-04 | Disposition: A | Discharge status: Home / Self Care | Payer: Medicaid Other | Source: Ambulatory Visit | Attending: Family Medicine | Admitting: Family Medicine

## 2021-03-04 ENCOUNTER — Ambulatory Visit (INDEPENDENT_AMBULATORY_CARE_PROVIDER_SITE_OTHER): Payer: Medicaid Other | Admitting: *Deleted

## 2021-03-04 VITALS — BP 121/56 | HR 67 | Ht 67.5 in | Wt 186.3 lb

## 2021-03-04 DIAGNOSIS — N898 Other specified noninflammatory disorders of vagina: Secondary | ICD-10-CM | POA: Diagnosis present

## 2021-03-04 DIAGNOSIS — Z202 Contact with and (suspected) exposure to infections with a predominantly sexual mode of transmission: Secondary | ICD-10-CM

## 2021-03-04 DIAGNOSIS — J029 Acute pharyngitis, unspecified: Secondary | ICD-10-CM

## 2021-03-04 NOTE — Addendum Note (Signed)
Addended by: Samuel Germany on: 03/04/2021 03:44 PM   Modules accepted: Orders

## 2021-03-04 NOTE — Addendum Note (Signed)
Addended by: Gerome Apley on: 03/04/2021 04:04 PM   Modules accepted: Orders

## 2021-03-04 NOTE — Progress Notes (Signed)
Here for nurse visit for self swab. States she has a new partner and wants to checked for std''s . States sometimes pain but otherwise no symptoms except vaginal discharge. Also requests throat swab for sore throat for gc and strep. Discussed with Dr. Rip Harbour and swab obtained. Bleu Minerd,RN

## 2021-03-05 LAB — CERVICOVAGINAL ANCILLARY ONLY
Bacterial Vaginitis (gardnerella): POSITIVE — AB
Candida Glabrata: NEGATIVE
Candida Vaginitis: NEGATIVE
Chlamydia: NEGATIVE
Comment: NEGATIVE
Comment: NEGATIVE
Comment: NEGATIVE
Comment: NEGATIVE
Comment: NEGATIVE
Comment: NORMAL
Neisseria Gonorrhea: NEGATIVE
Trichomonas: NEGATIVE

## 2021-03-06 ENCOUNTER — Encounter: Payer: Self-pay | Admitting: Lactation Services

## 2021-03-06 ENCOUNTER — Encounter: Payer: Self-pay | Admitting: Obstetrics and Gynecology

## 2021-03-06 ENCOUNTER — Other Ambulatory Visit: Payer: Self-pay | Admitting: Lactation Services

## 2021-03-06 MED ORDER — METRONIDAZOLE 500 MG PO TABS: 500.0000 mg | ORAL_TABLET | Freq: Two times a day (BID) | ORAL | 0 refills | Status: DC

## 2021-03-07 ENCOUNTER — Telehealth: Payer: Self-pay

## 2021-03-07 LAB — UPPER RESPIRATORY CULTURE, ROUTINE

## 2021-03-07 NOTE — Telephone Encounter (Signed)
Called Pt to go over Self swab test results & that she tested positive for BV and a Rx was sent to her Pharmacy for Flagyl. Pt verbalized understanding.

## 2021-04-17 ENCOUNTER — Ambulatory Visit (INDEPENDENT_AMBULATORY_CARE_PROVIDER_SITE_OTHER): Payer: Medicaid Other | Admitting: Nurse Practitioner

## 2021-04-17 ENCOUNTER — Other Ambulatory Visit: Payer: Self-pay

## 2021-04-17 ENCOUNTER — Encounter: Payer: Self-pay | Admitting: Nurse Practitioner

## 2021-04-17 VITALS — BP 127/59 | HR 78 | Temp 98.0°F | Ht 67.0 in | Wt 185.0 lb

## 2021-04-17 DIAGNOSIS — Z7689 Persons encountering health services in other specified circumstances: Secondary | ICD-10-CM

## 2021-04-17 DIAGNOSIS — D5720 Sickle-cell/Hb-C disease without crisis: Secondary | ICD-10-CM | POA: Diagnosis not present

## 2021-04-17 LAB — POCT URINALYSIS DIP (CLINITEK)
Bilirubin, UA: NEGATIVE
Blood, UA: NEGATIVE
Glucose, UA: NEGATIVE mg/dL
Ketones, POC UA: NEGATIVE mg/dL
Leukocytes, UA: NEGATIVE
Nitrite, UA: NEGATIVE
POC PROTEIN,UA: NEGATIVE
Spec Grav, UA: 1.02 (ref 1.010–1.025)
Urobilinogen, UA: 1 E.U./dL
pH, UA: 5.5 (ref 5.0–8.0)

## 2021-04-17 MED ORDER — IBUPROFEN 600 MG PO TABS: 600.0000 mg | ORAL_TABLET | Freq: Four times a day (QID) | ORAL | 0 refills | Status: AC | PRN

## 2021-04-17 MED ORDER — FOLIC ACID 1 MG PO TABS: 1.0000 mg | ORAL_TABLET | Freq: Every day | ORAL | 1 refills | Status: AC

## 2021-04-17 NOTE — Patient Instructions (Addendum)
Preventive Care 18-21 Years Old, Female Preventive care refers to lifestyle choices and visits with your health care provider that can promote health and wellness. At this stage in your life, you may start seeing a primary care physician instead of a pediatrician for your preventive care. Preventive care visits are also called wellness exams. What can I expect for my preventive care visit? Counseling During your preventive care visit, your health care provider may ask about your: Medical history, including: Past medical problems. Family medical history. Pregnancy history. Current health, including: Menstrual cycle. Method of birth control. Emotional well-being. Home life and relationship well-being. Sexual activity and sexual health. Lifestyle, including: Alcohol, nicotine or tobacco, and drug use. Access to firearms. Diet, exercise, and sleep habits. Sunscreen use. Motor vehicle safety. Physical exam Your health care provider may check your: Height and weight. These may be used to calculate your BMI (body mass index). BMI is a measurement that tells if you are at a healthy weight. Waist circumference. This measures the distance around your waistline. This measurement also tells if you are at a healthy weight and may help predict your risk of certain diseases, such as type 2 diabetes and high blood pressure. Heart rate and blood pressure. Body temperature. Skin for abnormal spots. Breasts. What immunizations do I need? Vaccines are usually given at various ages, according to a schedule. Your health care provider will recommend vaccines for you based on your age, medical history, and lifestyle or other factors, such as travel or where you work. What tests do I need? Screening Your health care provider may recommend screening tests for certain conditions. This may include: Vision and hearing tests. Lipid and cholesterol levels. Pelvic exam and Pap test. Hepatitis B test. Hepatitis  C test. HIV (human immunodeficiency virus) test. STI (sexually transmitted infection) testing, if you are at risk. Tuberculosis skin test if you have symptoms. BRCA-related cancer screening. This may be done if you have a family history of breast, ovarian, tubal, or peritoneal cancers. Talk with your health care provider about your test results, treatment options, and if necessary, the need for more tests. Follow these instructions at home: Eating and drinking  Eat a healthy diet that includes fresh fruits and vegetables, whole grains, lean protein, and low-fat dairy products. Drink enough fluid to keep your urine pale yellow. Do not drink alcohol if: Your health care provider tells you not to drink. You are pregnant, may be pregnant, or are planning to become pregnant. You are under the legal drinking age. In the [place] the legal drinking age is 21. If you drink alcohol: Limit how much you have to 0-1 drink a day. Know how much alcohol is in your drink. In the [place] one drink equals one 12 oz bottle of beer (355 mL), one 5 oz glass of wine (148 mL), or one 1 oz glass of hard liquor (44 mL). Lifestyle Brush your teeth every morning and night with fluoride toothpaste. Floss one time each day. Exercise for at least 30 minutes 5 or more days of the week. Do not use any products that contain nicotine or tobacco. These products include cigarettes, chewing tobacco, and vaping devices, such as e-cigarettes. If you need help quitting, ask your health care provider. Do not use drugs. If you are sexually active, practice safe sex. Use a condom or other form of protection to prevent STIs. If you do not wish to become pregnant, use a form of birth control. If you plan to become pregnant, see   your health care provider for a prepregnancy visit. Find healthy ways to manage stress, such as: Meditation, yoga, or listening to music. Journaling. Talking to a trusted person. Spending time with friends and  family. Safety Always wear your seat belt while driving or riding in a vehicle. Do not drive: If you have been drinking alcohol. Do not ride with someone who has been drinking. When you are tired or distracted. While texting. If you have been using any mind-altering substances or drugs. Wear a helmet and other protective equipment during sports activities. If you have firearms in your house, make sure you follow all gun safety procedures. Seek help if you have been bullied, physically abused, or sexually abused. Use the internet responsibly to avoid dangers, such as online bullying and online sex predators. What's next? Go to your health care provider once a year for an annual wellness visit. Ask your health care provider how often you should have your eyes and teeth checked. Stay up to date on all vaccines. This information is not intended to replace advice given to you by your health care provider. Make sure you discuss any questions you have with your health care provider. Document Revised: 08/08/2020 Document Reviewed: 08/08/2020 Elsevier Patient Education  [place].  Sickle Cell Anemia, Adult Sickle cell anemia is a condition where your red blood cells are shaped like sickles. Red blood cells carry oxygen through the body. Sickle-shaped cells do not live as long as normal red blood cells. They also clump together and block blood from flowing through the blood vessels. This prevents the body from getting enough oxygen. Sickle cell anemia causes organ damage and pain. It also increases the risk of infection. Follow these instructions at home: Medicines Take over-the-counter and prescription medicines only as told by your doctor. If you were prescribed an antibiotic medicine, take it as told by your doctor. Do not stop taking the antibiotic even if you start to feel better. If you develop a fever, do not take medicines to lower the fever right away. Tell your doctor about the  fever. Managing pain, stiffness, and swelling Try these methods to help with pain: Use a heating pad. Take a warm bath. Distract yourself, such as by watching TV. Eating and drinking Drink enough fluid to keep your pee (urine) clear or pale yellow. Drink more in hot weather and during exercise. Limit or avoid alcohol. Eat a healthy diet. Eat plenty of fruits, vegetables, whole grains, and lean protein. Take vitamins and supplements as told by your doctor. Traveling When traveling, keep these with you: Your medical information. The names of your doctors. Your medicines. If you need to take an airplane, talk to your doctor first. Activity Rest often. Avoid exercises that make your heart beat much faster, such as jogging. General instructions Do not use products that have nicotine or tobacco, such as cigarettes and e-cigarettes. If you need help quitting, ask your doctor. Consider wearing a medical alert bracelet. Avoid being in high places (high altitudes), such as mountains. Avoid very hot or cold temperatures. Avoid places where the temperature changes a lot. Keep all follow-up visits as told by your doctor. This is important. Contact a doctor if: A joint hurts. Your feet or hands hurt or swell. You feel tired (fatigued). Get help right away if: You have symptoms of infection. These include: Fever. Chills. Being very tired. Irritability. Poor eating. Throwing up (vomiting). You feel dizzy or faint. You have new stomach pain, especially on the left  side. You have a an erection (priapism) that lasts more than 4 hours. You have numbness in your arms or legs. You have a hard time moving your arms or legs. You have trouble talking. You have pain that does not go away when you take medicine. You are short of breath. You are breathing fast. You have a long-term cough. You have pain in your chest. You have a bad headache. You have a stiff neck. Your stomach looks bloated  even though you did not eat much. Your skin is pale. You suddenly cannot see well. Summary Sickle cell anemia is a condition where your red blood cells are shaped like sickles. Follow your doctor's advice on ways to manage pain, food to eat, activities to do, and steps to take for safe travel. Get medical help right away if you have any signs of infection, such as a fever. This information is not intended to replace advice given to you by your health care provider. Make sure you discuss any questions you have with your health care provider. Document Revised: 07/07/2019 Document Reviewed: 07/07/2019 Elsevier Patient Education  [place].

## 2021-04-17 NOTE — Progress Notes (Signed)
[facility] Patient [facility] [address] [place], [place]  [address] Phone:  [phone]   Fax:  [phone]   New Patient Office Visit  Subjective:  Patient ID: Amanda Burch, female    DOB: 11/21/2000  Age: 21 y.o. MRN: [id number]  CC:  Chief Complaint  Patient presents with   Establish Care    Pt is here to establish care. Pt stated she would like a physical done has a lot of body pain.    HPI Amanda Burch presents to establish care. 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   She is originally from [place]. She has a one year old son. She wanted to go to college and be on campus however she got pregnant. She lives with a roommate and cares for her son. 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 She has limitation and concerns with daycare related to trust issues.   She has back pain 6/10 back pain that comes or goes. She reports that she had a bad sickle cell crisis in 2016 and her back has not been the same. She did have an epidural for this has also changed her back. She has Sickle Cell Anemia. She does not take the folic acid.   Past Medical History:  Diagnosis Date   Chlamydia infection affecting pregnancy in second trimester 02/02/2020   Sickle cell anemia (HCC)    Sickle cell disease with crisis (HCC) 03/31/2014   Vision abnormalities     Past Surgical History:  Procedure Laterality Date   CESAREAN SECTION N/A 04/21/2020   Procedure: CESAREAN SECTION;  Surgeon: Catalina Antigua, MD;  Location: [facility];  Service: Obstetrics;  Laterality: N/A;   NO PAST SURGERIES      Family History  Adopted: Yes  Problem Relation Age of Onset   Asthma Mother    Sarcoidosis Mother    Deep vein thrombosis Mother    Sickle cell anemia Mother    Sickle cell anemia Father    Asthma Father     Social  History   Socioeconomic History   Marital status: Single    Spouse name: Not on file   Number of children: Not on file   Years of education: Not on file   Highest education level: Not on file  Occupational History   Occupation: [employer]  Tobacco Use   Smoking status: Never   Smokeless tobacco: Never  Vaping Use   Vaping Use: Never used  Substance and Sexual Activity   Alcohol use: No   Drug use: No   Sexual activity: Yes    Birth control/protection: None  Other Topics Concern   Not on file  Social History Narrative   Not on file   Social Determinants of Health   Financial Resource Strain: Not on file  Food Insecurity: Not on file  Transportation Needs: Not on file  Physical Activity: Not on file  Stress: Not on file  Social Connections: Not on file  Intimate Partner Violence: Not on file    ROS Review of Systems  Objective:   Today's Vitals: BP (!) 127/59    Pulse 78    Temp 98 F (36.7 C)    Ht 5\' 7"  (1.702 m)    Wt 185 lb 0.6 oz (83.9 kg)    LMP 03/27/2021 (Approximate)  SpO2 100%    BMI 28.98 kg/m   Physical Exam HENT:     Head: Normocephalic and atraumatic.     Right Ear: Tympanic membrane normal.     Left Ear: Tympanic membrane normal.     Nose: Nose normal.     Mouth/Throat:     Mouth: Mucous membranes are moist.  Eyes:     Pupils: Pupils are equal, round, and reactive to light.  Cardiovascular:     Rate and Rhythm: Normal rate and regular rhythm.     Pulses: Normal pulses.     Heart sounds: Normal heart sounds.  Pulmonary:     Effort: Pulmonary effort is normal.     Breath sounds: Normal breath sounds.  Abdominal:     General: Bowel sounds are normal.     Palpations: Abdomen is soft.  Musculoskeletal:        General: Normal range of motion.     Cervical back: Normal range of motion.  Skin:    General: Skin is warm and dry.     Capillary Refill: Capillary refill takes less than 2 seconds.  Neurological:     General: No focal deficit  present.     Mental Status: She is alert and oriented to person, place, and time.  Psychiatric:        Mood and Affect: Mood normal.        Behavior: Behavior normal.        Thought Content: Thought content normal.        Judgment: Judgment normal.    Assessment & Plan:   Problem List Items Addressed This Visit   None Visit Diagnoses     Encounter to establish care    -  Primary Discussed female health maintenance; SBE, annual CBE, PAP test Discussed general safety in vehicle and COVID Discussed regular hydration with water Discussed healthy diet and exercise and weight management Discussed sexual health  Discussed mental health Encouraged to call our office for an appointment with in ongoing concerns for questions.     Relevant Orders   POCT URINALYSIS DIP (CLINITEK) (Completed)   Sickle cell-hemoglobin C disease without crisis (HCC)     Ensure adequate hydration. Move frequently to reduce venous thromboembolism risk. Avoid situations that could lead to dehydration or could exacerbate pain Discussed S&S of infection, seizures, stroke acute chest, DVT and how important it is to seek medical attention Take medication as directed along with pain contract and overall compliance Discussed the risk related to opiate use (addition, tolerance and dependency)    Relevant Medications   folic acid (FOLVITE) 1 MG tablet   ibuprofen (ADVIL) 600 MG tablet   Other Relevant Orders   Sickle Cell Panel   Hgb Fractionation [place]       Outpatient Encounter Medications as of 04/17/2021  Medication Sig   folic acid (FOLVITE) 1 MG tablet Take 1 tablet (1 mg total) by mouth daily.   metroNIDAZOLE (FLAGYL) 500 MG tablet Take 1 tablet (500 mg total) by mouth 2 (two) times daily. (Patient not taking: Reported on 04/17/2021)   ibuprofen (ADVIL) 600 MG tablet Take 1 tablet (600 mg total) by mouth every 6 (six) hours as needed.   [DISCONTINUED] folic acid (FOLVITE) 1 MG tablet Take 1 tablet (1 mg  total) by mouth daily. (Patient not taking: Reported on 04/17/2021)   [DISCONTINUED] ibuprofen (ADVIL) 600 MG tablet Take 1 tablet (600 mg total) by mouth every 6 (six) hours as needed. (Patient not taking: Reported  on 03/04/2021)   No facility-administered encounter medications on file as of 04/17/2021.    Follow-up: Return in about 3 months (around 07/15/2021) for Physcial PREVENTIVE VISIT,EST,18-39 [99395], Follow up [facility] [address].   Barbette Merino, NP

## 2021-04-22 LAB — HGB FRAC BY HPLC+SOLUBILITY
Hgb A2: 3.2 % (ref 1.8–3.2)
Hgb A: 0 % — ABNORMAL LOW (ref 96.4–98.8)
Hgb C: 45.6 % — ABNORMAL HIGH
Hgb E: 0 %
Hgb F: 1.7 % (ref 0.0–2.0)
Hgb S: 49.5 % — ABNORMAL HIGH
Hgb Solubility: POSITIVE — AB
Hgb Variant: 0 %

## 2021-04-22 LAB — CMP14+CBC/D/PLT+FER+RETIC+V...
ALT: 8 IU/L (ref 0–32)
AST: 18 IU/L (ref 0–40)
Albumin/Globulin Ratio: 1.5 (ref 1.2–2.2)
Albumin: 4.3 g/dL (ref 3.9–5.0)
Alkaline Phosphatase: 63 IU/L (ref 42–106)
BUN/Creatinine Ratio: 7 — ABNORMAL LOW (ref 9–23)
BUN: 5 mg/dL — ABNORMAL LOW (ref 6–20)
Basophils Absolute: 0.1 10*3/uL (ref 0.0–0.2)
Basos: 1 %
Bilirubin Total: 0.7 mg/dL (ref 0.0–1.2)
CO2: 22 mmol/L (ref 20–29)
Calcium: 9.5 mg/dL (ref 8.7–10.2)
Chloride: 104 mmol/L (ref 96–106)
Creatinine, Ser: 0.72 mg/dL (ref 0.57–1.00)
EOS (ABSOLUTE): 0.5 10*3/uL — ABNORMAL HIGH (ref 0.0–0.4)
Eos: 9 %
Ferritin: 110 ng/mL (ref 15–150)
Globulin, Total: 2.8 g/dL (ref 1.5–4.5)
Glucose: 95 mg/dL (ref 70–99)
Hematocrit: 34 % (ref 34.0–46.6)
Hemoglobin: 10.9 g/dL — ABNORMAL LOW (ref 11.1–15.9)
Immature Grans (Abs): 0 10*3/uL (ref 0.0–0.1)
Immature Granulocytes: 0 %
Lymphocytes Absolute: 2 10*3/uL (ref 0.7–3.1)
Lymphs: 33 %
MCH: 29.4 pg (ref 26.6–33.0)
MCHC: 32.1 g/dL (ref 31.5–35.7)
MCV: 92 fL (ref 79–97)
Monocytes Absolute: 0.7 10*3/uL (ref 0.1–0.9)
Monocytes: 12 %
NRBC: 3 % — ABNORMAL HIGH (ref 0–0)
Neutrophils Absolute: 2.8 10*3/uL (ref 1.4–7.0)
Neutrophils: 45 %
Platelets: 335 10*3/uL (ref 150–450)
Potassium: 4.3 mmol/L (ref 3.5–5.2)
RBC: 3.71 x10E6/uL — ABNORMAL LOW (ref 3.77–5.28)
RDW: 15.4 % (ref 11.7–15.4)
Retic Ct Pct: 4.3 % — ABNORMAL HIGH (ref 0.6–2.6)
Sodium: 139 mmol/L (ref 134–144)
Total Protein: 7.1 g/dL (ref 6.0–8.5)
Vit D, 25-Hydroxy: 9.6 ng/mL — ABNORMAL LOW (ref 30.0–100.0)
WBC: 6.2 10*3/uL (ref 3.4–10.8)
eGFR: 123 mL/min/{1.73_m2} (ref >59)

## 2021-04-22 LAB — HGB FRACTIONATION CASCADE

## 2021-07-06 ENCOUNTER — Ambulatory Visit (HOSPITAL_COMMUNITY)
Admission: EM | Admit: 2021-07-06 | Discharge: 2021-07-06 | Disposition: A | Discharge status: Home / Self Care | Payer: Medicaid Other | Attending: Internal Medicine | Admitting: Internal Medicine

## 2021-07-06 ENCOUNTER — Other Ambulatory Visit: Payer: Self-pay

## 2021-07-06 ENCOUNTER — Encounter (HOSPITAL_COMMUNITY): Payer: Self-pay | Admitting: *Deleted

## 2021-07-06 DIAGNOSIS — N390 Urinary tract infection, site not specified: Secondary | ICD-10-CM | POA: Insufficient documentation

## 2021-07-06 DIAGNOSIS — Z3200 Encounter for pregnancy test, result unknown: Secondary | ICD-10-CM | POA: Diagnosis present

## 2021-07-06 DIAGNOSIS — Z202 Contact with and (suspected) exposure to infections with a predominantly sexual mode of transmission: Secondary | ICD-10-CM

## 2021-07-06 DIAGNOSIS — Z3202 Encounter for pregnancy test, result negative: Secondary | ICD-10-CM

## 2021-07-06 DIAGNOSIS — Z113 Encounter for screening for infections with a predominantly sexual mode of transmission: Secondary | ICD-10-CM | POA: Insufficient documentation

## 2021-07-06 LAB — POCT URINALYSIS DIPSTICK, ED / UC
Bilirubin Urine: NEGATIVE
Glucose, UA: NEGATIVE mg/dL
Hgb urine dipstick: NEGATIVE
Ketones, ur: NEGATIVE mg/dL
Nitrite: POSITIVE — AB
Protein, ur: NEGATIVE mg/dL
Specific Gravity, Urine: 1.015 (ref 1.005–1.030)
Urobilinogen, UA: 1 mg/dL (ref 0.0–1.0)
pH: 6.5 (ref 5.0–8.0)

## 2021-07-06 LAB — POC URINE PREG, ED: Preg Test, Ur: NEGATIVE

## 2021-07-06 LAB — HIV ANTIBODY (ROUTINE TESTING W REFLEX): HIV Screen 4th Generation wRfx: NONREACTIVE

## 2021-07-06 MED ORDER — CEPHALEXIN 500 MG PO CAPS: 500.0000 mg | ORAL_CAPSULE | Freq: Three times a day (TID) | ORAL | 0 refills | Status: DC

## 2021-07-06 MED ORDER — CETIRIZINE HCL 10 MG PO TABS: 10.0000 mg | ORAL_TABLET | Freq: Every day | ORAL | 2 refills | Status: AC

## 2021-07-06 NOTE — ED Triage Notes (Signed)
Pt reports a sore throat ,ABD pain ,vag discharge for 4 days. ?

## 2021-07-06 NOTE — Discharge Instructions (Addendum)
You were seen today urgent care for possible sexually transmitted disease and possible pregnancy.  Your pregnancy test was negative.  Your STI testing will come back in the next 2 to 3 days.  We will treat your vaginal discharge based on what your STI testing shows.  Your urinalysis showed a urinary tract infection today.  Please take Keflex 500 mg 3 times a day for 7 days to treat your urinary tract infection.  We have sent your urine off for culture to ensure that this is the correct antibiotic to use to treat the bacteria that is causing the infection.  We may call you to change your antibiotic depending on what your urine culture shows.  If you do not receive a phone call from [place], then your antibiotic is appropriate for the bacteria causing your infection. ? ?Please take cetirizine daily for the postnasal drainage visualized in your throat today.  I believe that that is the cause of your sore throat at this time.  You may also take Tylenol and ibuprofen as needed every 6 hours for throat pain.  You may also place 1 tablespoon of honey in 8 ounces of warm water to soothe your throat. ? ?If you develop any new or worsening symptoms or do not improve in the next 2 to 3 days, please return.  If your symptoms are severe, please go to the emergency room.  Follow-up with your primary care provider for further evaluation and management of your symptoms as well as ongoing wellness visits.  I hope you feel better! ?

## 2021-07-06 NOTE — ED Provider Notes (Signed)
?[facility] ? ? ? ?CSN: [id number] ?Arrival date & time: 07/06/21  1638 ? ? ?  ? ?History   ?Chief Complaint ?Chief Complaint  ?Patient presents with  ? Vaginal Discharge  ? Abdominal Pain  ? Sore Throat  ? ? ?HPI ?Amanda Burch is a 21 y.o. female.  ? ?Patient presents to urgent care for evaluation of vaginal discharge, sore throat, and abdominal pain. Vaginal discharge is "milky and watery" and has a "terrible smell".  A few days ago, she found out that her recent unprotected sexual partner had been exposed to gonorrhea. She last had intercourse with him one week ago. She is requesting all STI testing today and she thinks she could be pregnant. Her throat has been hurting for the last month. She relates this to a possible STI to her throat from oral sexual intercourse. She reports nausea in the morning that continues throughout the day for the last couple of days. Denies headaches, ear pain, swollen lymph nodes, shortness of breath, difficulty swallowing, or painful swallowing. Abdominal pain is to her lower middle abdomen. Reports urinary urgency, frequency, and dysuria as well. Denies constipation, diarrhea, nausea, vomiting, and fever.  ? ? ?Vaginal Discharge ?Associated symptoms: abdominal pain   ?Abdominal Pain ?Associated symptoms: vaginal discharge   ?Sore Throat ?Associated symptoms include abdominal pain.  ? ?Past Medical History:  ?Diagnosis Date  ? Chlamydia infection affecting pregnancy in second trimester 02/02/2020  ? Sickle cell anemia (HCC)   ? Sickle cell disease with crisis (HCC) 03/31/2014  ? Vision abnormalities   ? ? ?Patient Active Problem List  ? Diagnosis Date Noted  ? Possible pregnancy, not confirmed 07/06/2021  ? Trichomonal vaginitis 06/05/2020  ? Sickle cell anemia (HCC)   ? 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 ? ?Past Surgical History:  ?Procedure Laterality Date  ? CESAREAN SECTION N/A 04/21/2020  ? Procedure: CESAREAN SECTION;  Surgeon: Catalina Antiguaonstant, Peggy, MD;  Location: [facility];   Service: Obstetrics;  Laterality: N/A;  ? NO PAST SURGERIES    ? ? ?OB History   ? ? Gravida  ?1  ? Para  ?1  ? Term  ?1  ? Preterm  ?0  ? AB  ?0  ? Living  ?1  ?  ? ? SAB  ?0  ? IAB  ?0  ? Ectopic  ?0  ? Multiple  ?0  ? Live Births  ?1  ?   ?  ?  ? ? ? ?Home Medications   ? ?Prior to Admission medications   ?Medication Sig Start Date End Date Taking? Authorizing Provider  ?cephALEXin (KEFLEX) 500 MG capsule Take 1 capsule (500 mg total) by mouth 3 (three) times daily. 07/06/21  Yes Carlisle BeersStanhope, Brynne Doane M, FNP  ?cetirizine (ZYRTEC) 10 MG tablet Take 1 tablet (10 mg total) by mouth daily. 07/06/21  Yes Carlisle BeersStanhope, Jillisa Harris M, FNP  ?metroNIDAZOLE (FLAGYL) 500 MG tablet Take 1 tablet (500 mg total) by mouth 2 (two) times daily. ?Patient not taking: Reported on 04/17/2021 03/06/21   Hermina StaggersErvin, Michael L, MD  ?folic acid (FOLVITE) 1 MG tablet Take 1 tablet (1 mg total) by mouth daily. 04/17/21 07/16/21  Barbette MerinoKing, Crystal M, NP  ?ibuprofen (ADVIL) 600 MG tablet Take 1 tablet (600 mg total) by mouth every 6 (six) hours as needed. 04/17/21   Barbette MerinoKing, Crystal M, NP  ? ? ?Family History ?Family History  ?Adopted: Yes  ?Problem Relation Age of Onset  ? Asthma Mother   ? Sarcoidosis Mother   ?  Deep vein thrombosis Mother   ? Sickle cell anemia Mother   ? Sickle cell anemia Father   ? Asthma Father   ? ? ?Social History ?Social History  ? ?Tobacco Use  ? Smoking status: Never  ? Smokeless tobacco: Never  ?Vaping Use  ? Vaping Use: Never used  ?Substance Use Topics  ? Alcohol use: No  ? Drug use: No  ? ? ? ?Allergies   ?Phenergan [promethazine] and Stadol [butorphanol] ? ? ?Review of Systems ?Review of Systems  ?Gastrointestinal:  Positive for abdominal pain.  ?Genitourinary:  Positive for vaginal discharge.  ?Per HPI ? ?Physical Exam ?Triage Vital Signs ?ED Triage Vitals  ?Enc Vitals Group  ?   BP 07/06/21 1723 (!) 109/58  ?   Pulse Rate 07/06/21 1723 76  ?   Resp 07/06/21 1723 18  ?   Temp 07/06/21 1723 98.6 ?F (37 ?C)  ?   Temp src --   ?   SpO2  07/06/21 1723 98 %  ?   Weight --   ?   Height --   ?   Head Circumference --   ?   Peak Flow --   ?   Pain Score 07/06/21 1721 5  ?   Pain Loc --   ?   Pain Edu? --   ?   Excl. in GC? --   ? ?No data found. ? ?Updated Vital Signs ?BP (!) 109/58   Pulse 76   Temp 98.6 ?F (37 ?C)   Resp 18   LMP 06/12/2021   SpO2 98%  ? ?Visual Acuity ?Right Eye Distance:   ?Left Eye Distance:   ?Bilateral Distance:   ? ?Right Eye Near:   ?Left Eye Near:    ?Bilateral Near:    ? ?Physical Exam ?Vitals and nursing note reviewed.  ?Constitutional:   ?   General: She is not in acute distress. ?   Appearance: Normal appearance. She is well-developed and normal weight. She is not ill-appearing.  ?HENT:  ?   Head: Normocephalic and atraumatic.  ?   Right Ear: Tympanic membrane, ear canal and external ear normal.  ?   Left Ear: Tympanic membrane, ear canal and external ear normal.  ?   Nose: Rhinorrhea present.  ?   Mouth/Throat:  ?   Mouth: Mucous membranes are moist.  ?   Pharynx: Posterior oropharyngeal erythema present. No oropharyngeal exudate.  ?   Comments: Mild erythema to posterior oropharynx with small amount of clear postnasal drainage visualized. Airway intact and patent. ?Eyes:  ?   Extraocular Movements: Extraocular movements intact.  ?   Conjunctiva/sclera: Conjunctivae normal.  ?   Pupils: Pupils are equal, round, and reactive to light.  ?Cardiovascular:  ?   Rate and Rhythm: Normal rate and regular rhythm.  ?   Heart sounds: Normal heart sounds. No murmur heard. ?  No friction rub. No gallop.  ?Pulmonary:  ?   Effort: Pulmonary effort is normal. No respiratory distress.  ?   Breath sounds: Normal breath sounds. No wheezing, rhonchi or rales.  ?Chest:  ?   Chest wall: No tenderness.  ?Abdominal:  ?   General: Bowel sounds are normal.  ?   Palpations: Abdomen is soft.  ?   Tenderness: There is abdominal tenderness in the suprapubic area and left lower quadrant. There is no right CVA tenderness, left CVA tenderness,  guarding or rebound.  ?Musculoskeletal:     ?   General: No swelling.  ?  Cervical back: Neck supple.  ?Skin: ?   General: Skin is warm and dry.  ?   Capillary Refill: Capillary refill takes less than 2 seconds.  ?   Findings: No rash.  ?Neurological:  ?   General: No focal deficit present.  ?   Mental Status: She is alert and oriented to person, place, and time.  ?   Cranial Nerves: No cranial nerve deficit.  ?   Motor: No weakness.  ?Psychiatric:     ?   Mood and Affect: Mood normal.     ?   Behavior: Behavior normal.     ?   Thought Content: Thought content normal.     ?   Judgment: Judgment normal.  ? ? ? ?UC Treatments / Results  ?Labs ?(all labs ordered are listed, but only abnormal results are displayed) ?Labs Reviewed  ?POCT URINALYSIS DIPSTICK, ED / UC - Abnormal; Notable for the following components:  ?    Result Value  ? Nitrite POSITIVE (*)   ? Leukocytes,Ua SMALL (*)   ? All other components within normal limits  ?HIV ANTIBODY (ROUTINE TESTING W REFLEX)  ?RPR  ?POC URINE PREG, ED  ?CERVICOVAGINAL ANCILLARY ONLY  ? ? ?EKG ? ? ?Radiology ?No results found. ? ?Procedures ?Procedures (including critical care time) ? ?Medications Ordered in UC ?Medications - No data to display ? ?Initial Impression / Assessment and Plan / UC Course  ?I have reviewed the triage vital signs and the nursing notes. ? ?Pertinent labs & imaging results that were available during my care of the patient were reviewed by me and considered in my medical decision making (see chart for details). ? ?Patient is a 21 year old female presenting to urgent care with urinary symptoms with associated suprapubic abdominal pain and vaginal discharge.  STI testing pending.  Plan to treat based on STI testing results.  Patient will be called with these results in the next 2 to 3 days.  Pregnancy test is negative in clinic today.  Discussed contraception options to prevent unwanted pregnancy.  Patient states she will follow-up with health  department or primary care provider for contraception at a later time.  Discussed safe sexual practices to prevent the spread of STIs. ? ?Urinalysis shows positive nitrites and leukocytes today.  Plan to treat uncompli

## 2021-07-07 LAB — RPR: RPR Ser Ql: NONREACTIVE

## 2021-07-09 ENCOUNTER — Telehealth (HOSPITAL_COMMUNITY): Payer: Self-pay | Admitting: Emergency Medicine

## 2021-07-09 LAB — CERVICOVAGINAL ANCILLARY ONLY
Bacterial Vaginitis (gardnerella): POSITIVE — AB
Candida Glabrata: NEGATIVE
Candida Vaginitis: NEGATIVE
Chlamydia: NEGATIVE
Comment: NEGATIVE
Comment: NEGATIVE
Comment: NEGATIVE
Comment: NEGATIVE
Comment: NEGATIVE
Comment: NORMAL
Neisseria Gonorrhea: NEGATIVE
Trichomonas: NEGATIVE

## 2021-07-09 MED ORDER — METRONIDAZOLE 500 MG PO TABS: 500.0000 mg | ORAL_TABLET | Freq: Two times a day (BID) | ORAL | 0 refills | Status: DC

## 2021-07-15 ENCOUNTER — Ambulatory Visit: Payer: Self-pay | Admitting: Nurse Practitioner

## 2021-09-08 ENCOUNTER — Other Ambulatory Visit: Payer: Self-pay

## 2021-09-08 ENCOUNTER — Emergency Department (HOSPITAL_COMMUNITY)
Admission: EM | Admit: 2021-09-08 | Discharge: 2021-09-09 | Discharge status: Against Medical Advice | Payer: Medicaid Other | Attending: Student | Admitting: Student

## 2021-09-08 ENCOUNTER — Encounter (HOSPITAL_COMMUNITY): Payer: Self-pay | Admitting: Emergency Medicine

## 2021-09-08 DIAGNOSIS — Z20822 Contact with and (suspected) exposure to covid-19: Secondary | ICD-10-CM | POA: Insufficient documentation

## 2021-09-08 DIAGNOSIS — J029 Acute pharyngitis, unspecified: Secondary | ICD-10-CM | POA: Insufficient documentation

## 2021-09-08 DIAGNOSIS — Z5321 Procedure and treatment not carried out due to patient leaving prior to being seen by health care provider: Secondary | ICD-10-CM | POA: Diagnosis not present

## 2021-09-08 DIAGNOSIS — R000 Tachycardia, unspecified: Secondary | ICD-10-CM | POA: Diagnosis not present

## 2021-09-08 DIAGNOSIS — R0600 Dyspnea, unspecified: Secondary | ICD-10-CM | POA: Insufficient documentation

## 2021-09-08 DIAGNOSIS — D57219 Sickle-cell/Hb-C disease with crisis, unspecified: Secondary | ICD-10-CM | POA: Insufficient documentation

## 2021-09-08 DIAGNOSIS — R0981 Nasal congestion: Secondary | ICD-10-CM | POA: Diagnosis not present

## 2021-09-08 NOTE — ED Triage Notes (Signed)
Patient reports sickle cell pain at legs /arms/hands and back this evening .

## 2021-09-09 ENCOUNTER — Emergency Department (HOSPITAL_COMMUNITY): Payer: Medicaid Other

## 2021-09-09 LAB — COMPREHENSIVE METABOLIC PANEL
ALT: 15 U/L (ref 0–44)
AST: 18 U/L (ref 15–41)
Albumin: 3.9 g/dL (ref 3.5–5.0)
Alkaline Phosphatase: 67 U/L (ref 38–126)
Anion gap: 12 (ref 5–15)
BUN: 5 mg/dL — ABNORMAL LOW (ref 6–20)
CO2: 22 mmol/L (ref 22–32)
Calcium: 8.9 mg/dL (ref 8.9–10.3)
Chloride: 101 mmol/L (ref 98–111)
Creatinine, Ser: 0.75 mg/dL (ref 0.44–1.00)
GFR, Estimated: >60 mL/min (ref >60)
Glucose, Bld: 170 mg/dL — ABNORMAL HIGH (ref 70–99)
Potassium: 3.5 mmol/L (ref 3.5–5.1)
Sodium: 135 mmol/L (ref 135–145)
Total Bilirubin: 2.1 mg/dL — ABNORMAL HIGH (ref 0.3–1.2)
Total Protein: 7.9 g/dL (ref 6.5–8.1)

## 2021-09-09 LAB — CBC WITH DIFFERENTIAL/PLATELET
Abs Immature Granulocytes: 0.1 10*3/uL — ABNORMAL HIGH (ref 0.00–0.07)
Basophils Absolute: 0.1 10*3/uL (ref 0.0–0.1)
Basophils Relative: 0 %
Eosinophils Absolute: 0.1 10*3/uL (ref 0.0–0.5)
Eosinophils Relative: 1 %
HCT: 29.2 % — ABNORMAL LOW (ref 36.0–46.0)
Hemoglobin: 10.9 g/dL — ABNORMAL LOW (ref 12.0–15.0)
Immature Granulocytes: 1 %
Lymphocytes Relative: 14 %
Lymphs Abs: 2.3 10*3/uL (ref 0.7–4.0)
MCH: 29.8 pg (ref 26.0–34.0)
MCHC: 37.3 g/dL — ABNORMAL HIGH (ref 30.0–36.0)
MCV: 79.8 fL — ABNORMAL LOW (ref 80.0–100.0)
Monocytes Absolute: 1.8 10*3/uL — ABNORMAL HIGH (ref 0.1–1.0)
Monocytes Relative: 11 %
Neutro Abs: 11.9 10*3/uL — ABNORMAL HIGH (ref 1.7–7.7)
Neutrophils Relative %: 73 %
Platelets: 290 10*3/uL (ref 150–400)
RBC: 3.66 MIL/uL — ABNORMAL LOW (ref 3.87–5.11)
RDW: 13.8 % (ref 11.5–15.5)
WBC: 16.3 10*3/uL — ABNORMAL HIGH (ref 4.0–10.5)
nRBC: 0.2 % (ref 0.0–0.2)

## 2021-09-09 LAB — GROUP A STREP BY PCR: Group A Strep by PCR: NOT DETECTED

## 2021-09-09 LAB — I-STAT BETA HCG BLOOD, ED (MC, WL, AP ONLY): I-stat hCG, quantitative: <5 m[IU]/mL (ref <5)

## 2021-09-09 LAB — RESP PANEL BY RT-PCR (FLU A&B, COVID) ARPGX2
Influenza A by PCR: NEGATIVE
Influenza B by PCR: NEGATIVE
SARS Coronavirus 2 by RT PCR: NEGATIVE

## 2021-09-09 LAB — RETICULOCYTES
Immature Retic Fract: 13 % (ref 2.3–15.9)
RBC.: 3.59 MIL/uL — ABNORMAL LOW (ref 3.87–5.11)
Retic Count, Absolute: 125.3 10*3/uL (ref 19.0–186.0)
Retic Ct Pct: 3.5 % — ABNORMAL HIGH (ref 0.4–3.1)

## 2021-09-09 LAB — LACTIC ACID, PLASMA: Lactic Acid, Venous: 1.8 mmol/L (ref 0.5–1.9)

## 2021-09-09 MED ORDER — OXYCODONE-ACETAMINOPHEN 5-325 MG PO TABS
1.0000 | ORAL_TABLET | Freq: Once | ORAL | Status: AC
  Administered 2021-09-09: 1 via ORAL
  Filled 2021-09-09: qty 1

## 2021-09-09 MED ORDER — ACETAMINOPHEN 325 MG PO TABS
650.0000 mg | ORAL_TABLET | Freq: Once | ORAL | Status: AC
  Administered 2021-09-09: 650 mg via ORAL
  Filled 2021-09-09: qty 2

## 2021-09-09 NOTE — ED Notes (Signed)
Pt is asleep near the nurses station, she has not left.

## 2021-09-09 NOTE — ED Notes (Signed)
Pt has been called x3 for vitals check with no response. Pt has been moved off the floor.

## 2021-09-09 NOTE — ED Provider Triage Note (Signed)
Emergency Medicine Provider Triage Evaluation Note  Blessen Kimbrough , a 21 y.o. female  was evaluated in triage. Hx of sickle cell anemia who presents to the ED with sickle cell pain x 2 days. Pain to the extremities, shoulders, and neck. No alleviating factors. Reports congestion, sore throat, cough, dyspnea x 2 days. Denies fever.   Review of Systems  Per above.  Physical Exam  BP 120/76   Pulse (!) 112   Temp (!) 100.5 F (38.1 C) (Oral)   Resp 16   SpO2 100%  Gen:   Awake, no distress   Resp:  Normal effort  Cardiac:  Mild tachycardia MSK:   Moves extremities without difficulty  Other:  Posterior oropharyngeal erythema & exudates present. Tolerating own secretions without difficulty. No meningismus. Lungs CTA.   Medical Decision Making  Medically screening exam initiated at 12:01 AM.  Appropriate orders placed.  Ting Cage was informed that the remainder of the evaluation will be completed by another provider, this initial triage assessment does not replace that evaluation, and the importance of remaining in the ED until their evaluation is complete.  Patient presenting with diffuse pain which she feels is sickle cell related. Also has had some URI/LRI sxs. Febrile on arrival. Antipyretics ordered. Nontoxic appearing. CXR & Labs ordered   Cherly Anderson, PA-C 09/09/21 [id number]

## 2021-09-09 NOTE — ED Notes (Signed)
Called patient for vital check patient didn't answer will call again later

## 2021-09-23 ENCOUNTER — Ambulatory Visit (HOSPITAL_COMMUNITY)
Admission: EM | Admit: 2021-09-23 | Discharge: 2021-09-23 | Disposition: A | Discharge status: Home / Self Care | Payer: Medicaid Other | Attending: Emergency Medicine | Admitting: Emergency Medicine

## 2021-09-23 ENCOUNTER — Encounter (HOSPITAL_COMMUNITY): Payer: Self-pay

## 2021-09-23 DIAGNOSIS — N760 Acute vaginitis: Secondary | ICD-10-CM

## 2021-09-23 DIAGNOSIS — Z202 Contact with and (suspected) exposure to infections with a predominantly sexual mode of transmission: Secondary | ICD-10-CM

## 2021-09-23 DIAGNOSIS — Z113 Encounter for screening for infections with a predominantly sexual mode of transmission: Secondary | ICD-10-CM | POA: Diagnosis present

## 2021-09-23 DIAGNOSIS — Z20818 Contact with and (suspected) exposure to other bacterial communicable diseases: Secondary | ICD-10-CM | POA: Diagnosis present

## 2021-09-23 LAB — POCT URINALYSIS DIPSTICK, ED / UC
Glucose, UA: NEGATIVE mg/dL
Hgb urine dipstick: NEGATIVE
Nitrite: NEGATIVE
Protein, ur: NEGATIVE mg/dL
Specific Gravity, Urine: 1.02 (ref 1.005–1.030)
Urobilinogen, UA: 2 mg/dL — ABNORMAL HIGH (ref 0.0–1.0)
pH: 5.5 (ref 5.0–8.0)

## 2021-09-23 LAB — POC URINE PREG, ED: Preg Test, Ur: NEGATIVE

## 2021-09-23 MED ORDER — CLOTRIMAZOLE 1 % VA CREA: TOPICAL_CREAM | VAGINAL | 0 refills | Status: AC

## 2021-09-23 MED ORDER — CEFDINIR 300 MG PO CAPS: 300.0000 mg | ORAL_CAPSULE | Freq: Two times a day (BID) | ORAL | 0 refills | Status: AC

## 2021-09-23 NOTE — ED Provider Notes (Signed)
[facility]    CSN: [id number] Arrival date & time: 09/23/21  1649    HISTORY   Chief Complaint  Patient presents with   Sore Throat   Vaginal Discharge   HPI Amanda Burch is a pleasant, 21 y.o. female who presents to urgent care today. Patient complains of having a sore throat for the past 2 weeks which initially felt very sore, got better and got sore again about 3 days ago.  Patient states overall it is slightly better today but states that her child has been sick with strep throat.  Patient states she did not have any cough, runny nose, chills or fever.  To complicate matters, patient states she has been engaged in having oral sex with a partner who apparently has been having sex with other person without her knowledge.  Patient states she is also now having vaginal itching and discharge as well.  Patient denies burning with urination, perineal pain, pelvic pain, pelvic pressure, increased frequency of urination.  Patient states she did notice some vaginal bleeding not associated with her period, states she noticed it on the toilet tissue after wiping after urinating.  Patient also denies headache, nausea, vomiting, diarrhea.  The history is provided by the patient.   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 Authorizing Provider  cetirizine (ZYRTEC) 10 MG tablet Take 1 tablet (10 mg total) by mouth daily. 07/06/21   Carlisle Beers, FNP  ibuprofen (ADVIL) 600 MG tablet Take 1 tablet (600 mg total) by mouth every 6 (six) hours as needed. 04/17/21   Barbette Merino, NP    Family History Family History  Adopted: Yes  Problem Relation Age of Onset   Asthma Mother    Sarcoidosis Mother    Deep vein thrombosis Mother    Sickle cell anemia Mother    Sickle cell anemia Father    Asthma Father    Social History Social History   Tobacco Use   Smoking status: Never   Smokeless tobacco: Never  Vaping Use   Vaping Use: Never used  Substance Use Topics   Alcohol use: No   Drug use: No   Allergies   Phenergan [promethazine] and Stadol [butorphanol]  Review of Systems Review of Systems Pertinent findings revealed after performing a 14 point review of systems has been  noted in the history of present illness.  Physical Exam Triage Vital Signs ED Triage Vitals  Enc Vitals Group     BP 12/21/20 0827 (!) 147/82     Pulse Rate 12/21/20 0827 72     Resp 12/21/20 0827 18     Temp 12/21/20 0827 98.3 F (36.8 C)     Temp Source 12/21/20 0827 Oral     SpO2 12/21/20 0827 98 %     Weight --      Height --      Head Circumference --      Peak Flow --      Pain Score 12/21/20 0826 5     Pain Loc --      Pain Edu? --      Excl. in GC? --   No data found.  Updated Vital Signs BP 113/75 (BP Location: Right Arm)   Pulse 91   Temp 98 F (36.7 C) (Oral)   Resp 16   Ht 5\' 7"  (1.702 m)   Wt 175 lb (79.4 kg)   LMP 09/08/2021 (Exact Date)   SpO2 95%   Breastfeeding No   BMI 27.41 kg/m   Physical Exam Vitals and nursing note reviewed.  Constitutional:      General: She is  not in acute distress.    Appearance: Normal appearance. She is well-developed. She is not ill-appearing or toxic-appearing.  HENT:     Head: Normocephalic and atraumatic.     Salivary Glands: Right salivary gland is diffusely enlarged and tender. Left salivary gland is diffusely enlarged and tender.     Right Ear: Hearing, tympanic membrane, ear canal and external ear normal.     Left Ear: Hearing, tympanic membrane, ear canal and external ear normal.     Ears:     Comments: Bilateral EACs with mild erythema, bilateral TMs are normal    Nose: No mucosal edema, congestion or rhinorrhea.     Right Turbinates: Not enlarged, swollen or pale.     Left Turbinates: Not enlarged or swollen.     Right Sinus: No maxillary sinus tenderness or frontal sinus tenderness.     Left Sinus: No maxillary sinus tenderness or frontal sinus tenderness.     Mouth/Throat:     Lips: Pink. No lesions.     Mouth: Mucous membranes are moist. No oral lesions or angioedema.     Dentition: No gingival swelling.     Tongue: No lesions.     Palate: No mass.     Pharynx: Uvula midline. Pharyngeal swelling and posterior oropharyngeal erythema present. No oropharyngeal exudate or uvula swelling.     Tonsils: Tonsillar exudate present. 2+ on the right. 2+ on the left.  Eyes:     General: Lids are normal.        Right eye: No discharge.        Left eye: No discharge.     Extraocular Movements: Extraocular movements intact.     Conjunctiva/sclera: Conjunctivae normal.     Right eye: Right conjunctiva is not injected.     Left eye: Left conjunctiva is not injected.     Pupils: Pupils are equal, round, and reactive to light.  Neck:     Thyroid: No thyroid mass, thyromegaly or thyroid tenderness.     Trachea: Trachea and phonation normal. No abnormal tracheal secretions or tracheal deviation.     Comments: Voice is muffled Cardiovascular:     Rate and Rhythm: Normal rate and regular rhythm.  Pulses: Normal pulses.      Heart sounds: Normal heart sounds, S1 normal and S2 normal. No murmur heard.    No friction rub. No gallop.  Pulmonary:     Effort: Pulmonary effort is normal. No accessory muscle usage, prolonged expiration, respiratory distress or retractions.     Breath sounds: Normal breath sounds. No stridor, decreased air movement or transmitted upper airway sounds. No decreased breath sounds, wheezing, rhonchi or rales.  Chest:     Chest wall: No tenderness.  Abdominal:     General: Bowel sounds are normal. There is no distension.     Palpations: Abdomen is soft. There is no mass.     Tenderness: There is no abdominal tenderness. There is no right CVA tenderness, left CVA tenderness, guarding or rebound. Negative signs include Murphy's sign.     Hernia: No hernia is present.  Genitourinary:    Comments: Patient politely declines pelvic exam today, patient provided a vaginal swab for testing. Musculoskeletal:        General: No tenderness. Normal range of motion.     Cervical back: Full passive range of motion without pain, normal range of motion and neck supple. Normal range of motion.     Right lower leg: No edema.     Left lower leg: No edema.  Lymphadenopathy:     Cervical: No cervical adenopathy.  Skin:    General: Skin is warm and dry.     Findings: No erythema, lesion or rash.  Neurological:     General: No focal deficit present.     Mental Status: She is alert and oriented to person, place, and time. Mental status is at baseline.  Psychiatric:        Mood and Affect: Mood normal.        Behavior: Behavior normal.        Thought Content: Thought content normal.        Judgment: Judgment normal.     Visual Acuity Right Eye Distance:   Left Eye Distance:   Bilateral Distance:    Right Eye Near:   Left Eye Near:    Bilateral Near:     UC Couse / Diagnostics / Procedures:     Radiology No results found.  Procedures Procedures (including critical care time) EKG  Pending  results:  Labs Reviewed  POCT URINALYSIS DIPSTICK, ED / UC - Abnormal; Notable for the following components:      Result Value   Bilirubin Urine SMALL (*)    Ketones, ur TRACE (*)    Urobilinogen, UA 2.0 (*)    Leukocytes,Ua TRACE (*)    All other components within normal limits  POCT URINALYSIS DIP (MANUAL ENTRY)  POCT URINE PREGNANCY  POC URINE PREG, ED  CERVICOVAGINAL ANCILLARY ONLY  CYTOLOGY, (ORAL, ANAL, URETHRAL) ANCILLARY ONLY    Medications Ordered in UC: Medications - No data to display  UC Diagnoses / Final Clinical Impressions(s)   I have reviewed the triage vital signs and the nursing notes.  Pertinent labs & imaging results that were available during my care of the patient were reviewed by me and considered in my medical decision making (see chart for details).    Final diagnoses:  Exposure to Streptococcal pharyngitis  Potential exposure to STD  Screening examination for STD (sexually transmitted disease)  Vulvovaginitis   Given patient's exposure to strep throat and physical exam findings, will treat her empirically with cefdinir for presumed streptococcal pharyngitis.  STD swab performed of oropharynx  as well as vaginal area, will notify patient of results once received.  Patient provided with terconazole cream for relief of vaginal irritation while she waits for her STD results.  We will treat for STD based on results once received.  Return precautions advised.  ED Prescriptions     Medication Sig Dispense Auth. Provider   clotrimazole (GYNE-LOTRIMIN) 1 % vaginal cream Apply to external vaginal area 3-4 times daily as needed for vulvovaginitis. 45 g Theadora Rama Scales, PA-C   cefdinir (OMNICEF) 300 MG capsule Take 1 capsule (300 mg total) by mouth 2 (two) times daily for 10 days. 20 capsule Theadora Rama Scales, PA-C      PDMP not reviewed this encounter.  Disposition Upon Discharge:  Condition: stable for discharge home  Patient presented with  concern for an acute illness with associated systemic symptoms and significant discomfort requiring urgent management. In my opinion, this is a condition that a prudent lay person (someone who possesses an average knowledge of health and medicine) may potentially expect to result in complications if not addressed urgently such as respiratory distress, impairment of bodily function or dysfunction of bodily organs.   As such, the patient has been evaluated and assessed, work-up was performed and treatment was provided in alignment with urgent care protocols and evidence based medicine.  Patient/parent/caregiver has been advised that the patient may require follow up for further testing and/or treatment if the symptoms continue in spite of treatment, as clinically indicated and appropriate.  Routine symptom specific, illness specific and/or disease specific instructions were discussed with the patient and/or caregiver at length.  Prevention strategies for avoiding STD exposure were also discussed.  The patient will follow up with their current PCP if and as advised. If the patient does not currently have a PCP we will assist them in obtaining one.   The patient may need specialty follow up if the symptoms continue, in spite of conservative treatment and management, for further workup, evaluation, consultation and treatment as clinically indicated and appropriate.  Patient/parent/caregiver verbalized understanding and agreement of plan as discussed.  All questions were addressed during visit.  Please see discharge instructions below for further details of plan.  Discharge Instructions:   Discharge Instructions      Please begin cefdinir for exposure to strep throat, take 1 capsule twice daily for a full 10 days.  The results of your vaginal and oral swab tests which screens for BV, yeast, gonorrhea, chlamydia and trichomonas will be made available to you once it is complete.  This typically takes 3  to 5 days.  Please note that we do not test for herpes virus unless you are having an active lesion concerning for herpes outbreak.  Please abstain from sexual intercourse of any kind, vaginal, oral or anal, until you have received the results of your STD testing.     For comfort, while you are waiting for the result of your vaginal swab test, I have provided you with an external yeast infection cream that you can apply 3-4 times daily for relief of vaginal itching and burning.  I recommend that you abstain from sexual intercourse, tampon use or any other other intravaginal activities while you are waiting on these results and possible treatment.   Your urine pregnancy test today is negative.  Your urinalysis was not concerning for urinary tract infection.   If you have not had complete resolution of your symptoms after completing treatment, please return for repeat evaluation.   Thank you for  visiting urgent care today.  I appreciate the opportunity to participate in your care.     This office note has been dictated using Teaching laboratory technician.  Unfortunately, this method of dictation can sometimes lead to typographical or grammatical errors.  I apologize for your inconvenience in advance if this occurs.  Please do not hesitate to reach out to me if clarification is needed.       Theadora Rama Scales, PA-C 09/23/21 1909

## 2021-09-23 NOTE — Discharge Instructions (Addendum)
Please begin cefdinir for exposure to strep throat, take 1 capsule twice daily for a full 10 days.  The results of your vaginal and oral swab tests which screens for BV, yeast, gonorrhea, chlamydia and trichomonas will be made available to you once it is complete.  This typically takes 3 to 5 days.  Please note that we do not test for herpes virus unless you are having an active lesion concerning for herpes outbreak.  Please abstain from sexual intercourse of any kind, vaginal, oral or anal, until you have received the results of your STD testing.     For comfort, while you are waiting for the result of your vaginal swab test, I have provided you with an external yeast infection cream that you can apply 3-4 times daily for relief of vaginal itching and burning.  I recommend that you abstain from sexual intercourse, tampon use or any other other intravaginal activities while you are waiting on these results and possible treatment.   Your urine pregnancy test today is negative.  Your urinalysis was not concerning for urinary tract infection.   If you have not had complete resolution of your symptoms after completing treatment, please return for repeat evaluation.   Thank you for visiting urgent care today.  I appreciate the opportunity to participate in your care.

## 2021-09-23 NOTE — ED Triage Notes (Signed)
Patient having sore throat for 2 weeks. No cough, runny nose, chills, or fever.   Patient states she found out her partner was sleeping with 2 other people. States she is now having vaginal itching and discharge. No pain or burning with urination. Some blood in her urine outside of her period.

## 2021-09-24 ENCOUNTER — Telehealth (HOSPITAL_COMMUNITY): Payer: Self-pay | Admitting: Emergency Medicine

## 2021-09-24 LAB — CYTOLOGY, (ORAL, ANAL, URETHRAL) ANCILLARY ONLY
Chlamydia: NEGATIVE
Comment: NEGATIVE
Comment: NEGATIVE
Comment: NORMAL
Neisseria Gonorrhea: NEGATIVE
Trichomonas: NEGATIVE

## 2021-09-24 LAB — CERVICOVAGINAL ANCILLARY ONLY
Bacterial Vaginitis (gardnerella): POSITIVE — AB
Candida Glabrata: NEGATIVE
Candida Vaginitis: NEGATIVE
Chlamydia: NEGATIVE
Comment: NEGATIVE
Comment: NEGATIVE
Comment: NEGATIVE
Comment: NEGATIVE
Comment: NEGATIVE
Comment: NORMAL
Neisseria Gonorrhea: NEGATIVE
Trichomonas: POSITIVE — AB

## 2021-09-24 MED ORDER — METRONIDAZOLE 500 MG PO TABS: 500.0000 mg | ORAL_TABLET | Freq: Two times a day (BID) | ORAL | 0 refills | Status: AC
